# Patient Record
Sex: Male | Born: 1960
Health system: Southern US, Community
[De-identification: ages and names within clinical notes are randomized; demographics above are authoritative.]

## PROBLEM LIST (undated history)

## (undated) DIAGNOSIS — K219 Gastro-esophageal reflux disease without esophagitis: Secondary | ICD-10-CM

## (undated) DIAGNOSIS — I10 Essential (primary) hypertension: Secondary | ICD-10-CM

## (undated) DIAGNOSIS — T1490XA Injury, unspecified, initial encounter: Secondary | ICD-10-CM

## (undated) DIAGNOSIS — F329 Major depressive disorder, single episode, unspecified: Secondary | ICD-10-CM

## (undated) DIAGNOSIS — F32A Depression, unspecified: Secondary | ICD-10-CM

## (undated) DIAGNOSIS — E785 Hyperlipidemia, unspecified: Secondary | ICD-10-CM

## (undated) DIAGNOSIS — T50995A Adverse effect of other drugs, medicaments and biological substances, initial encounter: Secondary | ICD-10-CM

## (undated) DIAGNOSIS — K115 Sialolithiasis: Secondary | ICD-10-CM

## (undated) HISTORY — PX: OTHER SURGICAL HISTORY: SHX169

## (undated) HISTORY — PX: CHOLECYSTECTOMY: SHX55

## (undated) HISTORY — PX: CATARACT EXTRACTION: SUR2

## (undated) HISTORY — DX: Gastro-esophageal reflux disease without esophagitis: K21.9

## (undated) HISTORY — DX: Injury, unspecified, initial encounter: T14.90XA

## (undated) HISTORY — DX: Hyperlipidemia, unspecified: E78.5

## (undated) HISTORY — DX: Essential (primary) hypertension: I10

## (undated) HISTORY — DX: Major depressive disorder, single episode, unspecified: F32.9

## (undated) HISTORY — PX: TONSILLECTOMY: SUR1361

## (undated) HISTORY — DX: Depression, unspecified: F32.A

## (undated) HISTORY — DX: Sialolithiasis: K11.5

## (undated) HISTORY — PX: HERNIA REPAIR: SHX51

## (undated) HISTORY — DX: Adverse effect of other drugs, medicaments and biological substances, initial encounter: T50.995A

## (undated) HISTORY — PX: APPENDECTOMY: SHX54

---

## 1997-07-18 ENCOUNTER — Ambulatory Visit (HOSPITAL_COMMUNITY): Admission: RE | Admit: 1997-07-18 | Discharge: 1997-07-18 | Payer: Self-pay | Admitting: Gastroenterology

## 1998-02-27 ENCOUNTER — Ambulatory Visit (HOSPITAL_COMMUNITY): Admission: RE | Admit: 1998-02-27 | Discharge: 1998-02-27 | Payer: Self-pay | Admitting: Neurosurgery

## 1998-04-05 ENCOUNTER — Inpatient Hospital Stay (HOSPITAL_COMMUNITY): Admission: RE | Admit: 1998-04-05 | Discharge: 1998-04-09 | Payer: Self-pay | Admitting: Neurosurgery

## 1998-12-10 ENCOUNTER — Encounter: Payer: Self-pay | Admitting: *Deleted

## 1998-12-10 ENCOUNTER — Encounter: Admission: RE | Admit: 1998-12-10 | Discharge: 1998-12-10 | Payer: Self-pay | Admitting: *Deleted

## 1998-12-26 ENCOUNTER — Encounter: Payer: Self-pay | Admitting: Internal Medicine

## 1998-12-26 ENCOUNTER — Ambulatory Visit (HOSPITAL_COMMUNITY): Admission: RE | Admit: 1998-12-26 | Discharge: 1998-12-26 | Payer: Self-pay | Admitting: *Deleted

## 1999-01-02 ENCOUNTER — Encounter: Admission: RE | Admit: 1999-01-02 | Discharge: 1999-01-02 | Payer: Self-pay | Admitting: Neurosurgery

## 2001-01-13 ENCOUNTER — Emergency Department (HOSPITAL_COMMUNITY): Admission: EM | Admit: 2001-01-13 | Discharge: 2001-01-13 | Payer: Self-pay | Admitting: Emergency Medicine

## 2001-01-13 ENCOUNTER — Encounter: Payer: Self-pay | Admitting: Neurosurgery

## 2001-08-20 ENCOUNTER — Emergency Department (HOSPITAL_COMMUNITY): Admission: EM | Admit: 2001-08-20 | Discharge: 2001-08-20 | Payer: Self-pay | Admitting: *Deleted

## 2003-12-28 ENCOUNTER — Ambulatory Visit: Payer: Self-pay | Admitting: Internal Medicine

## 2004-03-22 ENCOUNTER — Ambulatory Visit: Payer: Self-pay | Admitting: Internal Medicine

## 2004-04-23 ENCOUNTER — Encounter: Admission: RE | Admit: 2004-04-23 | Discharge: 2004-05-14 | Payer: Self-pay | Admitting: Neurosurgery

## 2004-04-25 ENCOUNTER — Ambulatory Visit: Payer: Self-pay | Admitting: Internal Medicine

## 2004-05-06 ENCOUNTER — Ambulatory Visit: Payer: Self-pay | Admitting: Physical Medicine & Rehabilitation

## 2004-05-06 ENCOUNTER — Inpatient Hospital Stay (HOSPITAL_COMMUNITY): Admission: EM | Admit: 2004-05-06 | Discharge: 2004-05-14 | Payer: Self-pay | Admitting: Emergency Medicine

## 2004-05-17 ENCOUNTER — Ambulatory Visit: Payer: Self-pay | Admitting: Internal Medicine

## 2004-05-23 ENCOUNTER — Encounter: Admission: RE | Admit: 2004-05-23 | Discharge: 2004-06-03 | Payer: Self-pay | Admitting: Neurosurgery

## 2004-05-28 ENCOUNTER — Encounter: Admission: RE | Admit: 2004-05-28 | Discharge: 2004-05-28 | Payer: Self-pay | Admitting: Internal Medicine

## 2004-06-07 ENCOUNTER — Encounter: Payer: Self-pay | Admitting: Internal Medicine

## 2004-06-18 ENCOUNTER — Encounter: Payer: Self-pay | Admitting: Internal Medicine

## 2004-06-20 ENCOUNTER — Ambulatory Visit: Payer: Self-pay | Admitting: Internal Medicine

## 2004-06-25 ENCOUNTER — Encounter: Admission: RE | Admit: 2004-06-25 | Discharge: 2004-09-23 | Payer: Self-pay | Admitting: Internal Medicine

## 2004-06-25 ENCOUNTER — Ambulatory Visit: Payer: Self-pay | Admitting: Internal Medicine

## 2004-06-25 ENCOUNTER — Inpatient Hospital Stay (HOSPITAL_COMMUNITY): Admission: EM | Admit: 2004-06-25 | Discharge: 2004-06-29 | Payer: Self-pay | Admitting: Emergency Medicine

## 2004-07-03 ENCOUNTER — Ambulatory Visit: Payer: Self-pay | Admitting: Internal Medicine

## 2004-07-16 ENCOUNTER — Ambulatory Visit: Payer: Self-pay

## 2004-07-24 ENCOUNTER — Ambulatory Visit: Payer: Self-pay | Admitting: Internal Medicine

## 2004-08-05 ENCOUNTER — Ambulatory Visit: Payer: Self-pay

## 2005-03-24 ENCOUNTER — Ambulatory Visit: Payer: Self-pay | Admitting: Physical Medicine & Rehabilitation

## 2005-03-24 ENCOUNTER — Inpatient Hospital Stay (HOSPITAL_COMMUNITY): Admission: EM | Admit: 2005-03-24 | Discharge: 2005-03-28 | Payer: Self-pay | Admitting: Emergency Medicine

## 2005-03-28 ENCOUNTER — Inpatient Hospital Stay (HOSPITAL_COMMUNITY)
Admission: RE | Admit: 2005-03-28 | Discharge: 2005-03-31 | Payer: Self-pay | Admitting: Physical Medicine & Rehabilitation

## 2005-04-07 ENCOUNTER — Encounter
Admission: RE | Admit: 2005-04-07 | Discharge: 2005-04-30 | Payer: Self-pay | Admitting: Physical Medicine & Rehabilitation

## 2005-04-14 ENCOUNTER — Ambulatory Visit: Payer: Self-pay | Admitting: Internal Medicine

## 2005-04-22 ENCOUNTER — Ambulatory Visit: Payer: Self-pay | Admitting: Internal Medicine

## 2005-04-22 ENCOUNTER — Inpatient Hospital Stay (HOSPITAL_COMMUNITY): Admission: EM | Admit: 2005-04-22 | Discharge: 2005-04-29 | Payer: Self-pay | Admitting: Emergency Medicine

## 2005-05-07 ENCOUNTER — Encounter: Admission: RE | Admit: 2005-05-07 | Discharge: 2005-05-29 | Payer: Self-pay | Admitting: Internal Medicine

## 2005-05-08 ENCOUNTER — Ambulatory Visit: Payer: Self-pay | Admitting: Internal Medicine

## 2006-07-20 ENCOUNTER — Ambulatory Visit: Payer: Self-pay | Admitting: Internal Medicine

## 2006-07-20 LAB — CONVERTED CEMR LAB
ALT: 38 units/L (ref 0–40)
BUN: 8 mg/dL (ref 6–23)
Basophils Absolute: 0 10*3/uL (ref 0.0–0.1)
Basophils Relative: 0.7 % (ref 0.0–1.0)
Chloride: 102 meq/L (ref 96–112)
Creatinine, Ser: 1 mg/dL (ref 0.4–1.5)
Creatinine,U: 131.5 mg/dL
Direct LDL: 133.7 mg/dL
Eosinophils Absolute: 0.1 10*3/uL (ref 0.0–0.6)
HCT: 46.8 % (ref 39.0–52.0)
HDL: 24.8 mg/dL — ABNORMAL LOW (ref >39.0)
Hemoglobin: 16.3 g/dL (ref 13.0–17.0)
Lymphocytes Relative: 41 % (ref 12.0–46.0)
MCV: 89.7 fL (ref 78.0–100.0)
Microalb Creat Ratio: 2.3 mg/g (ref 0.0–30.0)
Microalb, Ur: 0.3 mg/dL (ref 0.0–1.9)
Neutro Abs: 2.8 10*3/uL (ref 1.4–7.7)
Neutrophils Relative %: 48.2 % (ref 43.0–77.0)
Total Bilirubin: 1.2 mg/dL (ref 0.3–1.2)
Total Protein: 7.4 g/dL (ref 6.0–8.3)
VLDL: 81 mg/dL — ABNORMAL HIGH (ref 0–40)
WBC: 5.6 10*3/uL (ref 4.5–10.5)

## 2006-08-12 ENCOUNTER — Encounter: Payer: Self-pay | Admitting: Internal Medicine

## 2006-08-12 DIAGNOSIS — E1169 Type 2 diabetes mellitus with other specified complication: Secondary | ICD-10-CM | POA: Insufficient documentation

## 2006-08-12 DIAGNOSIS — J45909 Unspecified asthma, uncomplicated: Secondary | ICD-10-CM | POA: Insufficient documentation

## 2006-08-12 DIAGNOSIS — E1159 Type 2 diabetes mellitus with other circulatory complications: Secondary | ICD-10-CM | POA: Insufficient documentation

## 2006-08-12 DIAGNOSIS — D869 Sarcoidosis, unspecified: Secondary | ICD-10-CM

## 2006-08-12 DIAGNOSIS — K219 Gastro-esophageal reflux disease without esophagitis: Secondary | ICD-10-CM

## 2006-08-12 DIAGNOSIS — F32A Depression, unspecified: Secondary | ICD-10-CM | POA: Insufficient documentation

## 2006-08-12 DIAGNOSIS — I10 Essential (primary) hypertension: Secondary | ICD-10-CM

## 2006-08-12 DIAGNOSIS — M109 Gout, unspecified: Secondary | ICD-10-CM

## 2006-08-12 DIAGNOSIS — E785 Hyperlipidemia, unspecified: Secondary | ICD-10-CM | POA: Insufficient documentation

## 2006-08-12 DIAGNOSIS — J309 Allergic rhinitis, unspecified: Secondary | ICD-10-CM | POA: Insufficient documentation

## 2006-08-12 DIAGNOSIS — F329 Major depressive disorder, single episode, unspecified: Secondary | ICD-10-CM

## 2006-09-21 ENCOUNTER — Telehealth: Payer: Self-pay | Admitting: Internal Medicine

## 2006-10-20 ENCOUNTER — Telehealth: Payer: Self-pay | Admitting: Internal Medicine

## 2006-11-02 ENCOUNTER — Ambulatory Visit: Payer: Self-pay | Admitting: Internal Medicine

## 2006-11-06 LAB — CONVERTED CEMR LAB
Cholesterol: 215 mg/dL (ref 0–200)
Direct LDL: 150.4 mg/dL
HDL: 21.2 mg/dL — ABNORMAL LOW (ref >39.0)
Total CHOL/HDL Ratio: 10.1
Triglycerides: 261 mg/dL (ref 0–149)

## 2006-12-29 ENCOUNTER — Ambulatory Visit: Payer: Self-pay | Admitting: Internal Medicine

## 2006-12-29 LAB — CONVERTED CEMR LAB: Blood Glucose, Fingerstick: 158

## 2007-01-19 ENCOUNTER — Telehealth: Payer: Self-pay | Admitting: Internal Medicine

## 2007-02-22 ENCOUNTER — Encounter: Payer: Self-pay | Admitting: Internal Medicine

## 2007-03-12 ENCOUNTER — Telehealth: Payer: Self-pay | Admitting: Internal Medicine

## 2007-04-03 IMAGING — CR DG LUMBAR SPINE COMPLETE 4+V
5 series · 5 of 5 positions shown · non-contrast
Comparison: none

HISTORY: Pain, fall

THORACIC SPINE 2 VIEWS:
Vertebral body heights maintained.
No fracture or subluxation.
No bone destruction.

[t l-spine oblique exposure (1 of 2)]
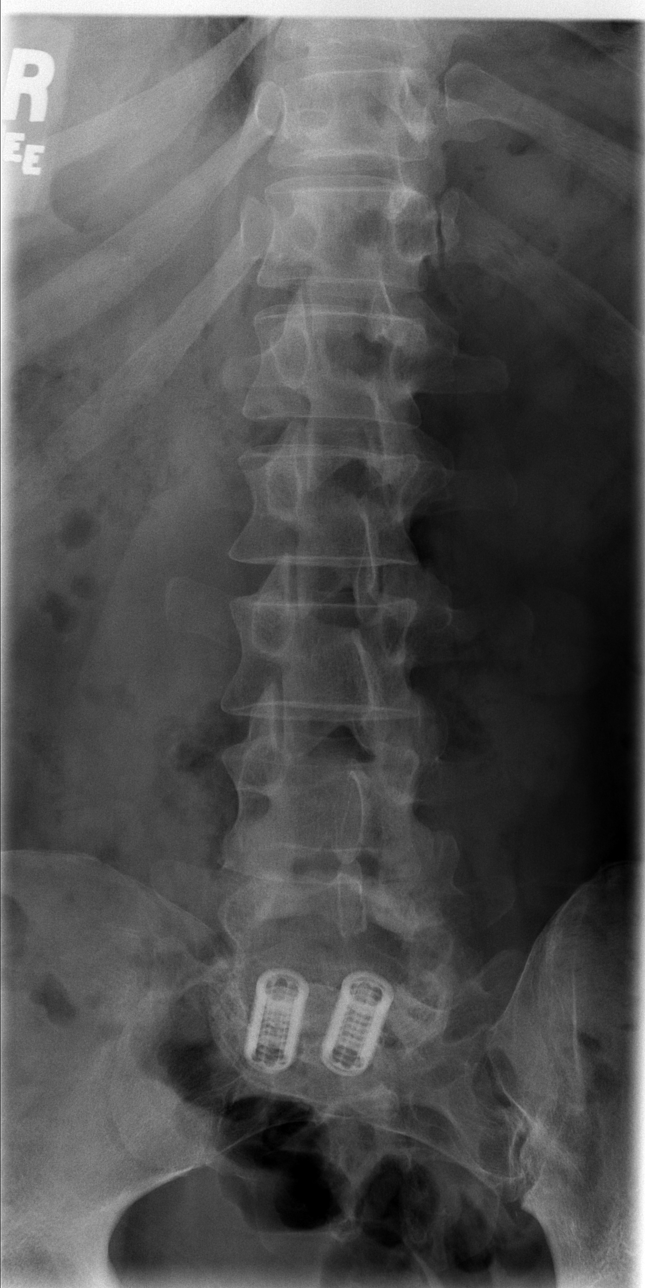

[t l-spine a.p.]
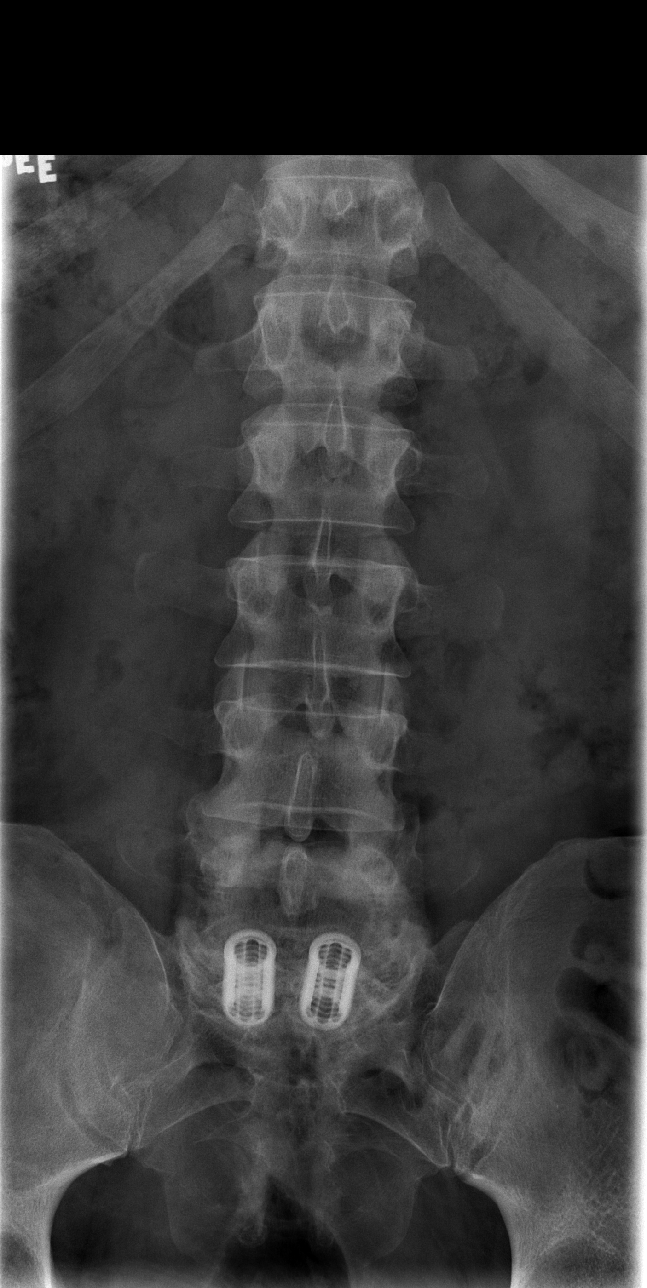

[t l-spine oblique exposure (2 of 2)]
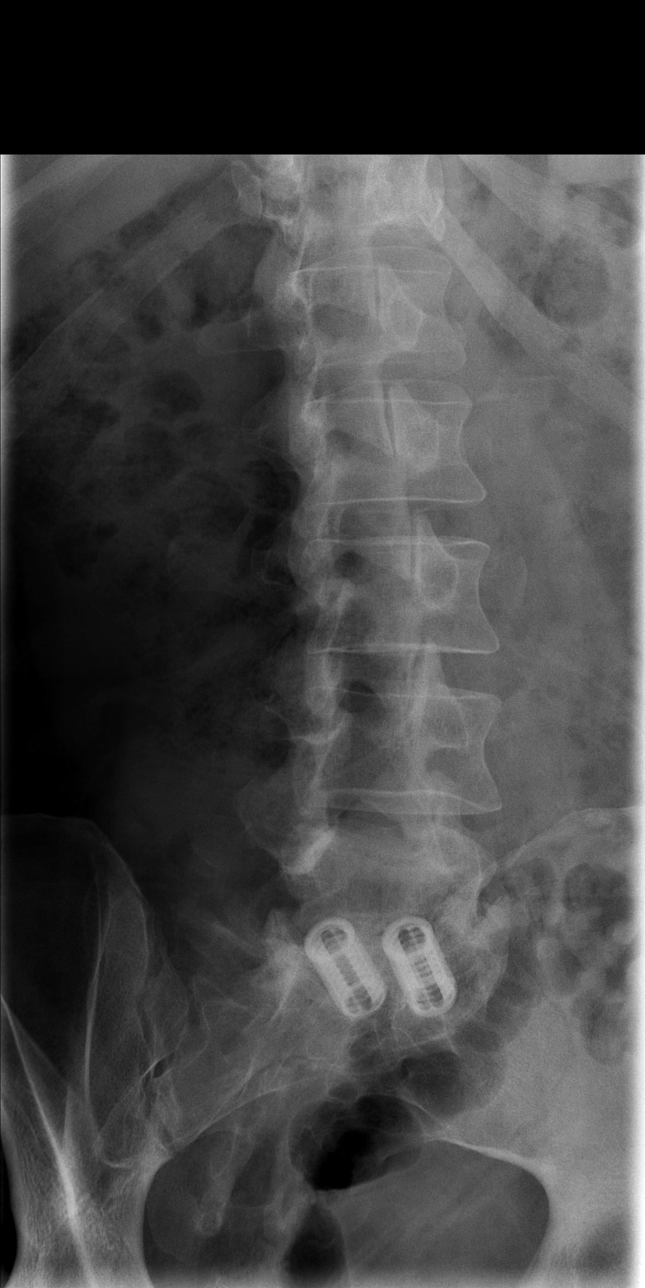

[t l-spine lat]
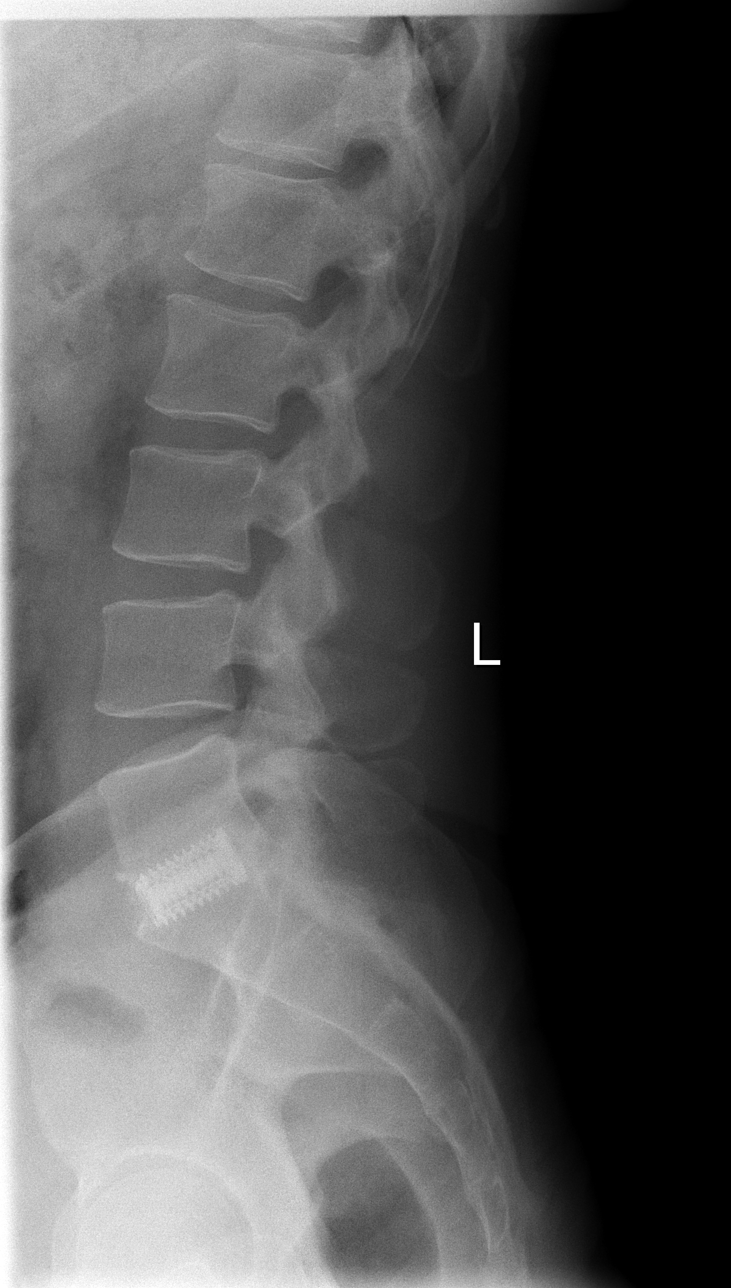

[t l-spine l5-s1 spot]
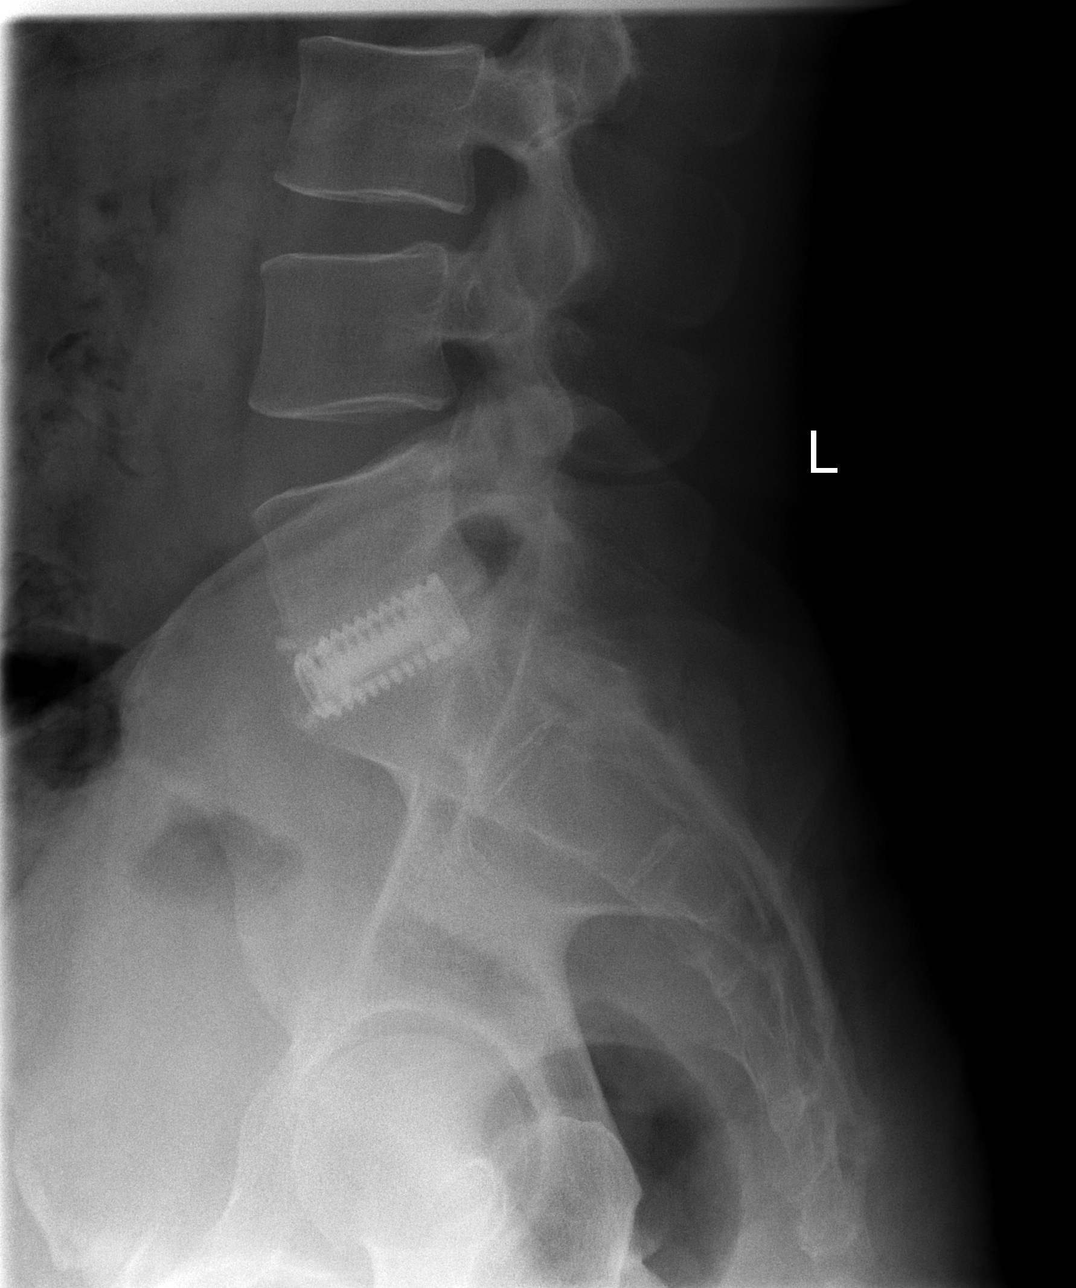

[5 of 5 positions shown; findings below may reference images not displayed]

IMPRESSION: No acute bony abnormalities.

LUMBAR SPINE 4 VIEWS:

Bilateral ray cages at lumbosacral junction.
No fracture or subluxation.
Vertebral body and disc space heights otherwise maintained.
No spondylolysis or bone destruction.
IMPRESSION: Fusion at L5-S1.
No acute bony abnormalities.

LEFT SHOULDER 3 VIEWS:

AC joint alignment normal.
No fracture, dislocation, or bone destruction.
IMPRESSION: No acute bony abnormalities.

LEFT HUMERUS 2 VIEWS:

No fracture, dislocation, or bone destruction.
Mineralization normal.
IMPRESSION: No acute abnormalities.

## 2007-04-03 IMAGING — CR DG SHOULDER 2+V*L*
3 series · 3 of 3 positions shown · non-contrast
Comparison: none

HISTORY: Pain, fall

THORACIC SPINE 2 VIEWS:
Vertebral body heights maintained.
No fracture or subluxation.
No bone destruction.

[t shoulder ap internal left]
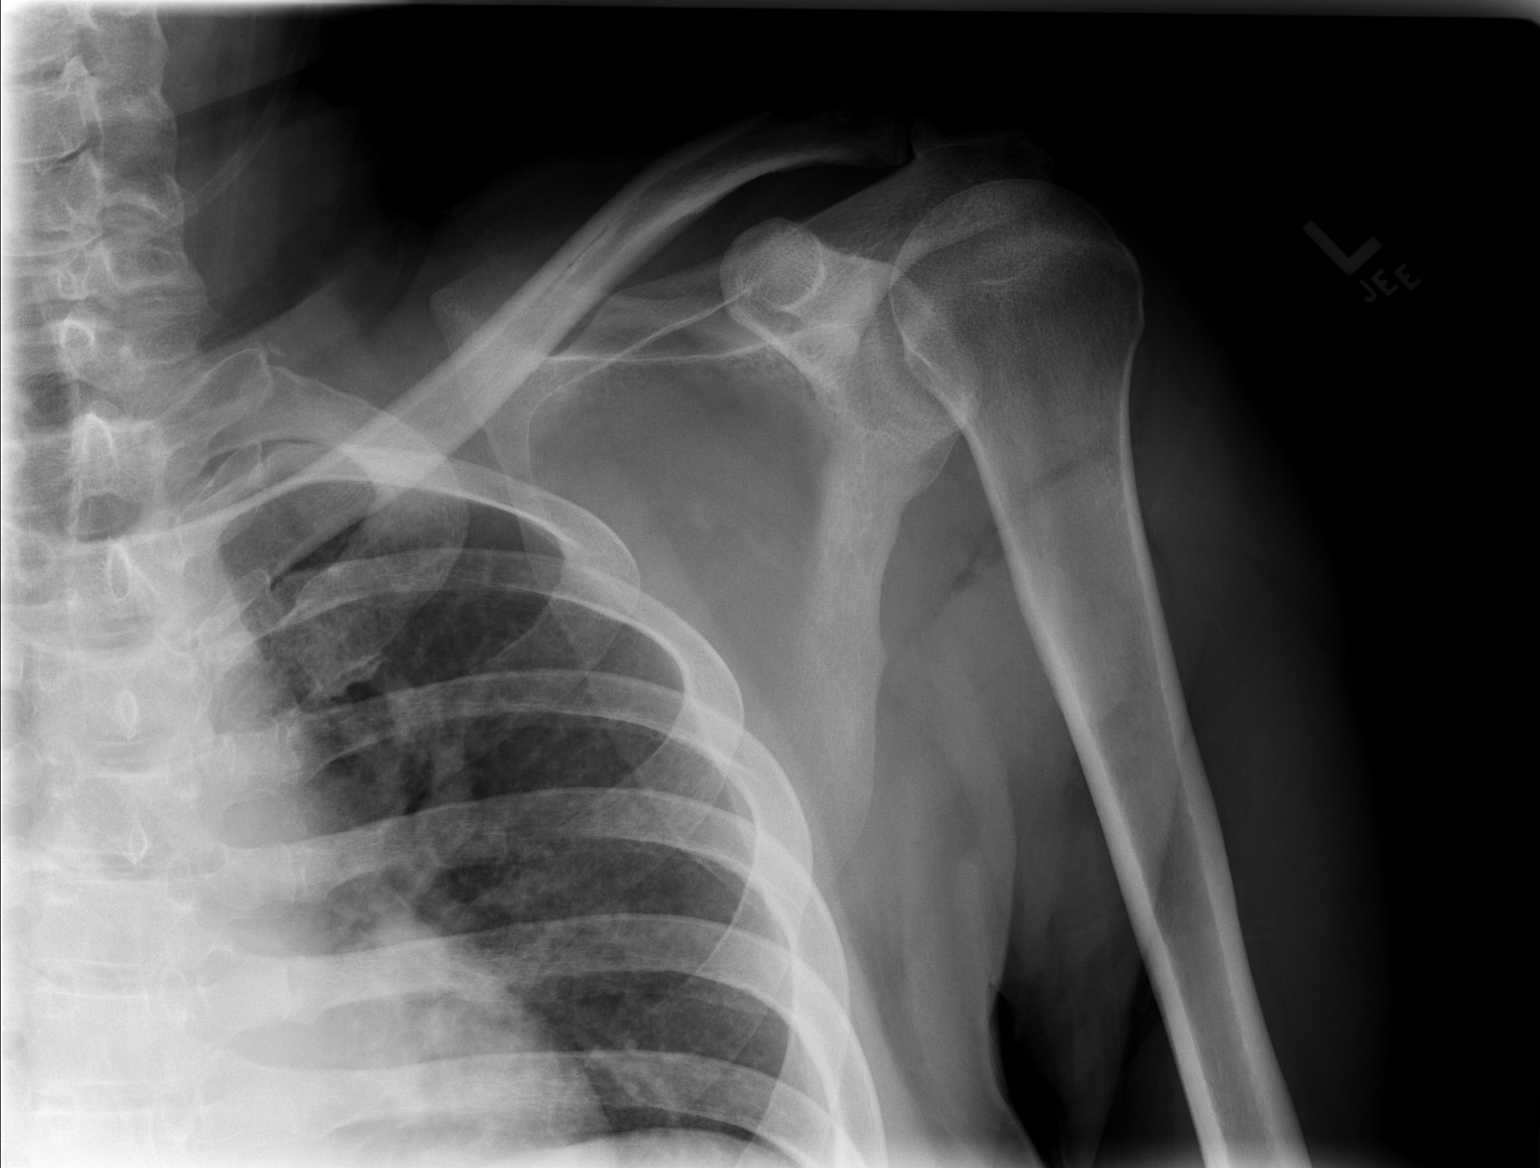

[t shoulder ap external left]
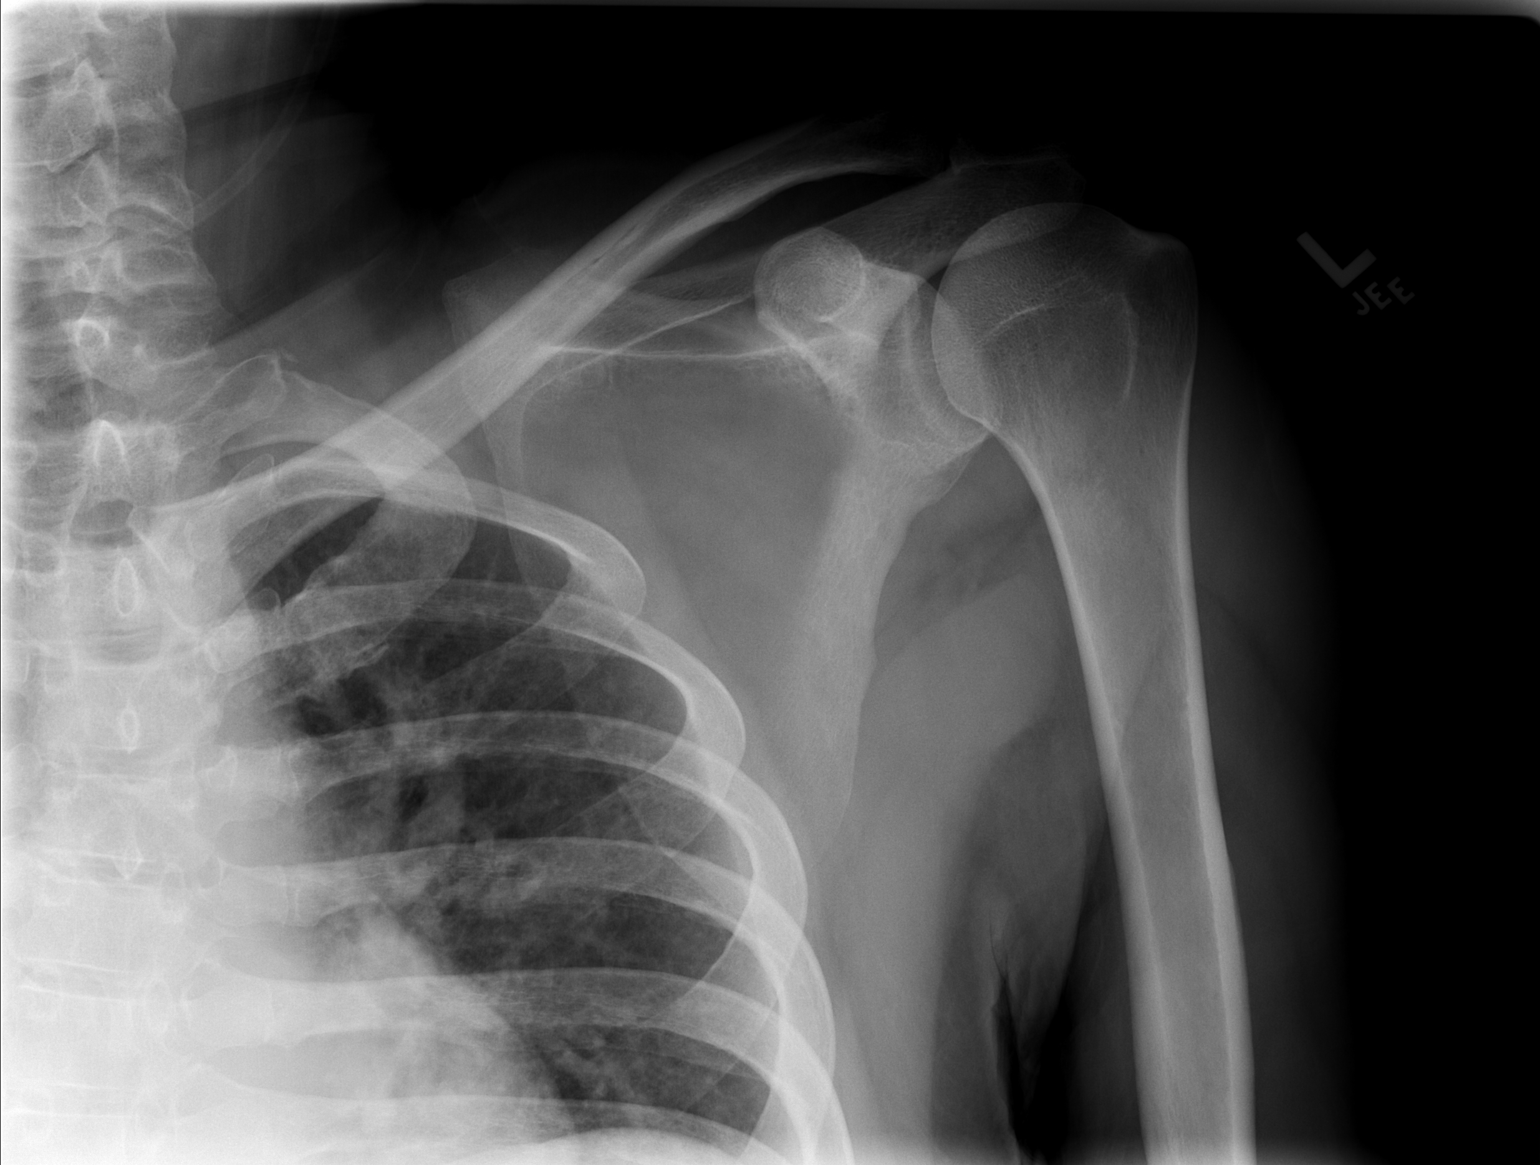

[t shoulder y view left]
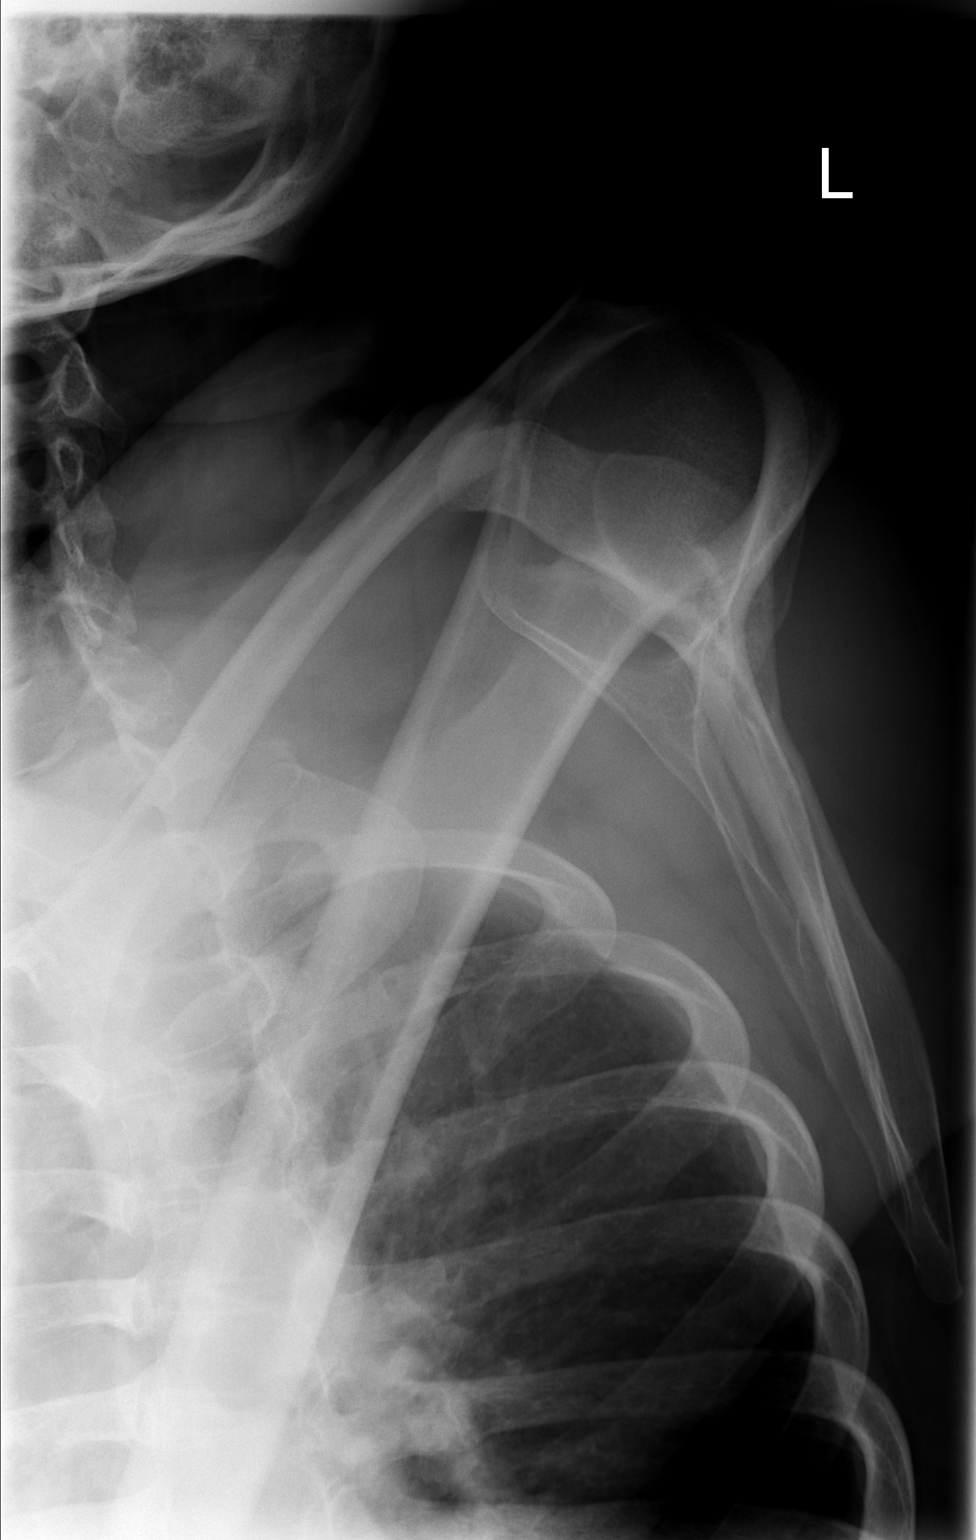

[3 of 3 positions shown; findings below may reference images not displayed]

IMPRESSION: No acute bony abnormalities.

LUMBAR SPINE 4 VIEWS:

Bilateral ray cages at lumbosacral junction.
No fracture or subluxation.
Vertebral body and disc space heights otherwise maintained.
No spondylolysis or bone destruction.
IMPRESSION: Fusion at L5-S1.
No acute bony abnormalities.

LEFT SHOULDER 3 VIEWS:

AC joint alignment normal.
No fracture, dislocation, or bone destruction.
IMPRESSION: No acute bony abnormalities.

LEFT HUMERUS 2 VIEWS:

No fracture, dislocation, or bone destruction.
Mineralization normal.
IMPRESSION: No acute abnormalities.

## 2007-04-22 ENCOUNTER — Telehealth: Payer: Self-pay | Admitting: Internal Medicine

## 2007-05-03 ENCOUNTER — Ambulatory Visit: Payer: Self-pay | Admitting: Internal Medicine

## 2007-06-02 LAB — CONVERTED CEMR LAB
BUN: 13 mg/dL (ref 6–23)
CO2: 31 meq/L (ref 19–32)
Chloride: 103 meq/L (ref 96–112)
Cholesterol: 243 mg/dL (ref 0–200)
GFR calc non Af Amer: 110 mL/min
Hgb A1c MFr Bld: 6.4 % — ABNORMAL HIGH (ref 4.6–6.0)
Sodium: 139 meq/L (ref 135–145)
VLDL: 119 mg/dL — ABNORMAL HIGH (ref 0–40)

## 2007-07-09 ENCOUNTER — Telehealth: Payer: Self-pay | Admitting: *Deleted

## 2007-07-20 ENCOUNTER — Telehealth: Payer: Self-pay | Admitting: Internal Medicine

## 2007-11-02 ENCOUNTER — Telehealth: Payer: Self-pay | Admitting: *Deleted

## 2008-02-18 LAB — HM DIABETES EYE EXAM: HM Diabetic Eye Exam: NORMAL

## 2008-05-26 ENCOUNTER — Telehealth: Payer: Self-pay | Admitting: *Deleted

## 2008-08-07 ENCOUNTER — Telehealth: Payer: Self-pay | Admitting: Internal Medicine

## 2008-08-14 ENCOUNTER — Ambulatory Visit: Payer: Self-pay | Admitting: Internal Medicine

## 2008-08-14 LAB — CONVERTED CEMR LAB
Creatinine,U: 98 mg/dL
Ketones, urine, test strip: NEGATIVE
Microalb, Ur: 0.3 mg/dL (ref 0.0–1.9)
Nitrite: NEGATIVE

## 2008-08-18 ENCOUNTER — Encounter: Payer: Self-pay | Admitting: *Deleted

## 2008-08-24 LAB — CONVERTED CEMR LAB
Albumin: 4.1 g/dL (ref 3.5–5.2)
Alkaline Phosphatase: 61 units/L (ref 39–117)
Basophils Absolute: 0 10*3/uL (ref 0.0–0.1)
Basophils Relative: 0.6 % (ref 0.0–3.0)
Cholesterol: 195 mg/dL (ref 0–200)
Creatinine, Ser: 0.9 mg/dL (ref 0.4–1.5)
Eosinophils Absolute: 0.1 10*3/uL (ref 0.0–0.7)
HCT: 43 % (ref 39.0–52.0)
HDL: 25.4 mg/dL — ABNORMAL LOW (ref >39.00)
Hgb A1c MFr Bld: 7.6 % — ABNORMAL HIGH (ref 4.6–6.5)
Lymphocytes Relative: 36.4 % (ref 12.0–46.0)
MCHC: 35.4 g/dL (ref 30.0–36.0)
MCV: 90.4 fL (ref 78.0–100.0)
Monocytes Relative: 6.2 % (ref 3.0–12.0)
Neutro Abs: 3.9 10*3/uL (ref 1.4–7.7)
Neutrophils Relative %: 54.9 % (ref 43.0–77.0)
Platelets: 171 10*3/uL (ref 150.0–400.0)
Potassium: 4 meq/L (ref 3.5–5.1)
RBC: 4.75 M/uL (ref 4.22–5.81)
Sodium: 140 meq/L (ref 135–145)
TSH: 1.49 microintl units/mL (ref 0.35–5.50)
Total Protein: 7 g/dL (ref 6.0–8.3)
Triglycerides: 516 mg/dL — ABNORMAL HIGH (ref 0.0–149.0)
VLDL: 103.2 mg/dL — ABNORMAL HIGH (ref 0.0–40.0)

## 2008-08-25 ENCOUNTER — Telehealth: Payer: Self-pay | Admitting: *Deleted

## 2008-09-06 ENCOUNTER — Telehealth: Payer: Self-pay | Admitting: *Deleted

## 2008-09-20 ENCOUNTER — Telehealth: Payer: Self-pay | Admitting: *Deleted

## 2008-10-02 ENCOUNTER — Telehealth: Payer: Self-pay | Admitting: *Deleted

## 2008-10-09 ENCOUNTER — Ambulatory Visit: Payer: Self-pay | Admitting: Internal Medicine

## 2008-10-09 DIAGNOSIS — T50995A Adverse effect of other drugs, medicaments and biological substances, initial encounter: Secondary | ICD-10-CM

## 2008-10-09 DIAGNOSIS — E119 Type 2 diabetes mellitus without complications: Secondary | ICD-10-CM

## 2008-10-09 HISTORY — DX: Adverse effect of other drugs, medicaments and biological substances, initial encounter: T50.995A

## 2008-10-09 LAB — CONVERTED CEMR LAB: Blood Glucose, Fingerstick: 136

## 2008-10-11 ENCOUNTER — Telehealth: Payer: Self-pay | Admitting: *Deleted

## 2008-11-06 ENCOUNTER — Telehealth: Payer: Self-pay | Admitting: *Deleted

## 2008-11-30 ENCOUNTER — Telehealth: Payer: Self-pay | Admitting: *Deleted

## 2008-12-25 ENCOUNTER — Telehealth: Payer: Self-pay | Admitting: Internal Medicine

## 2009-01-03 ENCOUNTER — Telehealth: Payer: Self-pay | Admitting: *Deleted

## 2009-03-05 ENCOUNTER — Telehealth: Payer: Self-pay | Admitting: *Deleted

## 2009-03-07 ENCOUNTER — Telehealth: Payer: Self-pay | Admitting: Internal Medicine

## 2009-04-02 ENCOUNTER — Ambulatory Visit: Payer: Self-pay | Admitting: Internal Medicine

## 2009-04-02 DIAGNOSIS — M6740 Ganglion, unspecified site: Secondary | ICD-10-CM

## 2009-04-11 ENCOUNTER — Encounter: Payer: Self-pay | Admitting: *Deleted

## 2009-04-12 LAB — CONVERTED CEMR LAB
BUN: 14 mg/dL (ref 6–23)
Calcium: 8.9 mg/dL (ref 8.4–10.5)
Chloride: 106 meq/L (ref 96–112)
Creatinine, Ser: 0.9 mg/dL (ref 0.4–1.5)
Direct LDL: 107.7 mg/dL
GFR calc non Af Amer: 95.23 mL/min (ref >60)
Glucose, Bld: 129 mg/dL — ABNORMAL HIGH (ref 70–99)
Microalb Creat Ratio: 7.1 mg/g (ref 0.0–30.0)
Triglycerides: 363 mg/dL — ABNORMAL HIGH (ref 0.0–149.0)

## 2009-04-30 ENCOUNTER — Telehealth: Payer: Self-pay | Admitting: *Deleted

## 2009-08-27 ENCOUNTER — Telehealth: Payer: Self-pay | Admitting: *Deleted

## 2009-09-01 ENCOUNTER — Encounter: Payer: Self-pay | Admitting: *Deleted

## 2009-09-01 ENCOUNTER — Encounter: Payer: Self-pay | Admitting: Internal Medicine

## 2009-09-01 LAB — CONVERTED CEMR LAB
HDL: 27 mg/dL
Triglyceride fasting, serum: 249 mg/dL

## 2009-10-01 ENCOUNTER — Telehealth: Payer: Self-pay | Admitting: Internal Medicine

## 2009-12-17 ENCOUNTER — Encounter: Payer: Self-pay | Admitting: *Deleted

## 2010-01-21 ENCOUNTER — Ambulatory Visit: Payer: Self-pay | Admitting: Internal Medicine

## 2010-01-24 LAB — CONVERTED CEMR LAB
ALT: 45 units/L (ref 0–53)
Alkaline Phosphatase: 48 units/L (ref 39–117)
Basophils Absolute: 0 10*3/uL (ref 0.0–0.1)
Calcium: 9.4 mg/dL (ref 8.4–10.5)
Direct LDL: 154.1 mg/dL
Eosinophils Relative: 2.5 % (ref 0.0–5.0)
Glucose, Bld: 162 mg/dL — ABNORMAL HIGH (ref 70–99)
HCT: 43.6 % (ref 39.0–52.0)
HDL: 30.2 mg/dL — ABNORMAL LOW (ref >39.00)
MCHC: 34.7 g/dL (ref 30.0–36.0)
MCV: 90.9 fL (ref 78.0–100.0)
Microalb Creat Ratio: 0.3 mg/g (ref 0.0–30.0)
Monocytes Absolute: 0.4 10*3/uL (ref 0.1–1.0)
Neutrophils Relative %: 45.8 % (ref 43.0–77.0)
Potassium: 4.2 meq/L (ref 3.5–5.1)
RBC: 4.79 M/uL (ref 4.22–5.81)
RDW: 13.5 % (ref 11.5–14.6)
Sodium: 135 meq/L (ref 135–145)
Total Bilirubin: 0.7 mg/dL (ref 0.3–1.2)
Total Protein: 7 g/dL (ref 6.0–8.3)
VLDL: 71.2 mg/dL — ABNORMAL HIGH (ref 0.0–40.0)

## 2010-02-19 ENCOUNTER — Telehealth: Payer: Self-pay | Admitting: *Deleted

## 2010-03-19 NOTE — Letter (Signed)
Summary: Generic Letter  [facility] at [facility]  [address] [place], [place] [address]   Phone: [phone]  Fax: [phone]    04/11/2009  HARLO FABELA [address] [place], [place]  [address]  Dear Mr. PAIGE,  I have tried to call you about your test results but was unable to leave a message. Please call our office at [phone].         Sincerely,   Tor Netters, CMA (AAMA)

## 2010-03-19 NOTE — Progress Notes (Signed)
Summary: refill  Phone Note From Pharmacy   [facility]er: [facility] [address]* Reason for Call: Needs renewal Details for Reason: metformin Initial call taken by: Romualdo Bolk, CMA Duncan Dull),  April 30, 2009 5:00 PM  Follow-up for Phone Call        Rx sent to pharmacy. Follow-up by: Romualdo Bolk, CMA (AAMA),  April 30, 2009 5:00 PM    Prescriptions: GLUCOPHAGE XR 500 MG XR24H-TAB (METFORMIN HCL) 2  by mouth once daily  #180 x 1   Entered by:   Romualdo Bolk, CMA (AAMA)   Authorized by:   Madelin Headings MD   Signed by:   Romualdo Bolk, CMA (AAMA) on 04/30/2009   Method used:   Electronically to        [facility] (retail)       [address]       [place], [place]  [address]       Ph: [phone]       Fax: [phone]   RxID:   [id number]

## 2010-03-19 NOTE — Progress Notes (Signed)
Summary: refill on metformin  Phone Note From Pharmacy   Caller: [facility] Reason for Call: Needs renewal Details for Reason: Metformin  Initial call taken by: Romualdo Bolk, CMA Duncan Dull),  March 05, 2009 4:49 PM  Follow-up for Phone Call        Rx sent electronically but pt needs to schedule a follow up appt before next refill. Follow-up by: Romualdo Bolk, CMA (AAMA),  March 05, 2009 4:50 PM    Prescriptions: GLUCOPHAGE XR 500 MG XR24H-TAB (METFORMIN HCL) 2  by mouth once daily  #180 x 0   Entered by:   Romualdo Bolk, CMA (AAMA)   Authorized by:   Madelin Headings MD   Signed by:   Romualdo Bolk, CMA (AAMA) on 03/05/2009   Method used:   Electronically to        [facility] [place] (retail)       [address]       [place], [place]  [address]       Ph: [phone]       Fax: [phone]   RxID:   [id number]

## 2010-03-19 NOTE — Progress Notes (Signed)
Summary: refill on metformin   Phone Note From Pharmacy Call back at [phone] (wife)   [facility]er: [facility] [address] Reason for Call: Needs renewal Details for Reason: Metformin Er 500 Initial call taken by: Romualdo Bolk, CMA Duncan Dull),  October 01, 2009 1:46 PM  Follow-up for Phone Call        Rx sent to pharmacy x 1 but pt needs to have a1c, lipids lfts BMP  and then OV before next refill. Left message for wife to call back about this. Follow-up by: Romualdo Bolk, CMA Duncan Dull),  October 01, 2009 1:47 PM  Additional Follow-up for Phone Call Additional follow up Details #1::        LM for wife to call back to schedule labs and rov for husband. Additional Follow-up by: Romualdo Bolk, CMA Duncan Dull),  October 05, 2009 10:55 AM    Additional Follow-up for Phone Call Additional follow up Details #2::    Pt's wife aware of this and pt's job just ended and has no ins or money to come in. He did have some labs about 4 weeks ago before he lost his job. She is going to get these results to [place]. He is currently interviewing for a job.  Romualdo Bolk, CMA (AAMA)  October 11, 2009 11:10 AM   Prescriptions: GLUCOPHAGE XR 500 MG XR24H-TAB (METFORMIN HCL) 2  by mouth once daily  #60 x 0   Entered by:   Romualdo Bolk, CMA (AAMA)   Authorized by:   Madelin Headings MD   Signed by:   Romualdo Bolk, CMA (AAMA) on 10/01/2009   Method used:   Electronically to        [facility] [place] (retail)       [address]       [place], [place]  [address]       Ph: [phone]       Fax: [phone]   RxID:   [id number]

## 2010-03-19 NOTE — Assessment & Plan Note (Signed)
Summary: follow appt/pt coming in fasting/cjr   Vital Signs:  Patient profile:   50 year old male Weight:      228.5 pounds Pulse rate:   66 / minute BP sitting:   112 / 79  (right arm) Cuff size:   large  Vitals Entered By: Romualdo Bolk, CMA (AAMA) (April 02, 2009 8:34 AM) CC: Follow-up visit on DM and BP. Pt is fasting for labs., Hypertension Management CBG Result 139   History of Present Illness: Scott Pineda  comesin with wife for follow up of multiple medical problems specifically DM. Still driving and has schedule where  limited times to come to the office.  Since last visit  here  there have been no major changes in health status  .    Dm  120 110. up this am.  No low No new meds. Since last  visit  had  left knee injury.   ligament. on the job getting better  Has mildly tender lump on right wrist without numbness or weakness   CV pulm. no symptom . no cough or change in Gi. now able to tolerate the metformin.    Hypertension History:      He denies headache, chest pain, palpitations, dyspnea with exertion, orthopnea, PND, peripheral edema, visual symptoms, neurologic problems, syncope, and side effects from treatment.  He notes no problems with any antihypertensive medication side effects.        Positive major cardiovascular risk factors include male age 106 years old or older, diabetes, hyperlipidemia, and hypertension.  Negative major cardiovascular risk factors include non-tobacco-user status.      Preventive Screening-Counseling & Management  Alcohol-Tobacco     Alcohol drinks/day: 0     Smoking Status: never     Passive Smoke Exposure: no  Caffeine-Diet-Exercise     Caffeine use/day: 1-2 cup      Does Patient Exercise: yes  Current Medications (verified): 1)  Onetouch Ultra Test  Strp (Glucose Blood) .... One Touch Ultra 2 Test Strips. Pt Uses Two Times A Day or As Directed 2)  Prinivil 5 Mg Tabs (Lisinopril) .... Take 2  Tablet By Mouth Once A  Day. 3)  Zoloft 100 Mg Tabs (Sertraline Hcl) .... Take 1 By Mouth Once Daily 4)  Glucophage Xr 500 Mg Xr24h-Tab (Metformin Hcl) .... 2  By Mouth Once Daily  Allergies (verified): 1)  Motrin (Ibuprofen)  Past History:  Past medical, surgical, family and social histories (including risk factors) reviewed, and no changes noted (except as noted below).  Past Medical History: Reviewed history from 08/14/2008 and no changes required. Allergic rhinitis Asthma Depression GERD Gout Hyperlipidemia Hypertension see pst hx of falling trauma and back pain with side effects of medication  Parkinsonian now resolved  salivary stone   extraction    Past Surgical History: Reviewed history from 08/14/2008 and no changes required. Appendectomy Cholecystectomy Tonsillectomy Hernia Repair cataract surgery left 2009    right  2006  Past History:  Care Management: Psychiatry: Christen Bame eppes   ENT:  Annitta Jersey ? gramig  for shoulder   Family History: Reviewed history from 05/03/2007 and no changes required. adopted  Social History: Reviewed history from 08/14/2008 and no changes required.  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 hhof 4  9 dogs dod had puppies   Review of  Systems  The patient denies anorexia, fever, weight loss, weight gain, vision loss, decreased hearing, hoarseness, chest pain, syncope, dyspnea on exertion, peripheral edema, prolonged cough, headaches, hemoptysis, abdominal pain, melena, hematochezia, severe indigestion/heartburn, hematuria, muscle weakness, unusual weight change, abnormal bleeding, enlarged lymph nodes, and angioedema.         got though the  holidays   ok.    lump on right wrist   Physical Exam  General:  Well-developed,well-nourished,in no acute distress; alert,appropriate and cooperative throughout examination Head:   normocephalic and atraumatic.   Eyes:  vision grossly intact, pupils equal, and pupils round.  glasses  Ears:  R ear normal, L ear normal, and no external deformities.   Mouth:  pharynx pink and moist.   Neck:  No deformities, masses, or tenderness noted. Lungs:  Normal respiratory effort, chest expands symmetrically. Lungs are clear to auscultation, no crackles or wheezes.no dullness.   Heart:  Normal rate and regular rhythm. S1 and S2 normal without gallop, murmur, click, rub or other extra sounds.no lifts.   Abdomen:  Bowel sounds positive,abdomen soft and non-tender without masses, organomegaly or hernias noted. well healed scar midline Msk:  no joint swelling and no joint warmth.    ganglion cyst  dorsal aspect radial side of right wrist.   nl rom no redness or warmth.  Pulses:  pulses intact without delay   Extremities:  no clubbing cyanosis or edema  Neurologic:  alert & oriented X3, sensation intact to light touch, and gait normal.   Skin:  turgor normal and color normal.   le top of feet and to mid tibia discrete follicular red   rash  non tender and no vesicle or  ulcers .    Cervical Nodes:  No lymphadenopathy noted Psych:  Oriented X3, normally interactive, good eye contact, not anxious appearing, and not depressed appearing.    Diabetes Management Exam:    Foot Exam (with socks and/or shoes not present):       Sensory-Monofilament:          Left foot: normal          Right foot: normal       Inspection:          Left foot: normal          Right foot: normal       Nails:          Left foot: normal          Right foot: normal    Eye Exam:       Eye Exam done elsewhere          Date: 02/18/2008          Results: normal          Done by: Epps   Impression & Recommendations:  Problem # 1:  DIABETES-TYPE 2 (ICD-250.00) will get eye exam soon.  His updated medication list for this problem includes:    Prinivil 5 Mg Tabs (Lisinopril) .Marland Kitchen... Take 2  tablet by mouth once a  day.    Glucophage Xr 500 Mg Xr24h-tab (Metformin hcl) .Marland Kitchen... 2  by mouth once daily  Orders: TLB-A1C / Hgb A1C (Glycohemoglobin) (83036-A1C) TLB-Lipid Panel (80061-LIPID) TLB-Microalbumin/Creat Ratio, Urine (82043-MALB) TLB-BMP (Basic Metabolic Panel-BMET) (80048-METABOL)  Problem # 2:  HYPERTENSION (ICD-401.9) controlled  His updated medication list for this problem includes:    Prinivil 5 Mg Tabs (Lisinopril) .Marland Kitchen... Take 2  tablet by mouth once a day.  Orders: TLB-A1C /  Hgb A1C (Glycohemoglobin) (83036-A1C) TLB-Lipid Panel (80061-LIPID) TLB-Microalbumin/Creat Ratio, Urine (82043-MALB)  Problem # 3:  HYPERLIPIDEMIA (ICD-272.4)  Orders: TLB-A1C / Hgb A1C (Glycohemoglobin) (83036-A1C) TLB-Lipid Panel (80061-LIPID) TLB-Microalbumin/Creat Ratio, Urine (82043-MALB)  Labs Reviewed: SGOT: 25 (08/14/2008)   SGPT: 32 (08/14/2008)  Prior 10 Yr Risk Heart Disease: Not enough information (08/14/2008)   HDL:25.40 (08/14/2008), 22.4 (05/03/2007)  LDL:DEL (05/03/2007), DEL (11/02/2006)  Chol:195 (08/14/2008), 243 (05/03/2007)  Trig:516.0 (08/14/2008), 596 (05/03/2007)  Problem # 4:  GANGLION CYST, WRIST, RIGHT (ICD-727.41) Assessment: New no alarm features  disc options  conservative rx and observation  refer if needed.    Problem # 5:  RASH AND OTHER NONSPECIFIC SKIN ERUPTION (ICD-782.1) unsure cause  on distal extremities  looks like follicular based   but localized  .  will follow.  Complete Medication List: 1)  Onetouch Ultra Test Strp (Glucose blood) .... One touch ultra 2 test strips. pt uses two times a day or as directed 2)  Prinivil 5 Mg Tabs (Lisinopril) .... Take 2  tablet by mouth once a day. 3)  Zoloft 100 Mg Tabs (Sertraline hcl) .... Take 1 by mouth once daily 4)  Glucophage Xr 500 Mg Xr24h-tab (Metformin hcl) .... 2  by mouth once daily  Other Orders: Capillary Blood Glucose/CBG ([address])  Hypertension Assessment/Plan:      The patient's hypertensive risk group is  category C: Target organ damage and/or diabetes.  Today's blood pressure is 112/79.  His blood pressure goal is < 140/90.  Patient Instructions: 1)  continue medications 2)  You will be informed of lab results when available.  3)  depending on results  plan follow up . Prescriptions: GLUCOPHAGE XR 500 MG XR24H-TAB (METFORMIN HCL) 2  by mouth once daily  #180 x 3   Entered by:   Romualdo Bolk, CMA (AAMA)   Authorized by:   Madelin Headings MD   Signed by:   Romualdo Bolk, CMA (AAMA) on 04/02/2009   Method used:   Electronically to        [facility] [place] (retail)       [address]       [place], [place]  [address]       Ph: [phone]       Fax: [phone]   RxID:   [id number]

## 2010-03-19 NOTE — Progress Notes (Signed)
Summary: blood sugar is high  Phone Note Call from Patient Call back at [phone] or [phone]   Caller: Wife- Diane Summary of Call: Pt is getting ready to have a [facility] cpx and blood surgar has been running high which has gotten pt nervous. I have left a message for pt's wife to call back on both numbers. Initial call taken by: Romualdo Bolk, CMA Duncan Dull),  August 27, 2009 10:52 AM  Follow-up for Phone Call        Spoke to pt's wife- he has been taking robitussin otc not diabetic version and halls cough drops for a cold. BS has been afternoon 200 and 228. Fasting around 160. This has been like for about 1 week. He has stopped the robitussin and halls last night. BS around 10am around 200. Pt got up at 4am. FBS 130 this am.  Follow-up by: Romualdo Bolk, CMA (AAMA),  August 27, 2009 1:21 PM  Additional Follow-up for Phone Call Additional follow up Details #1::        He is due in next month for labs  anyway    Please see lasttime we were unable to get in touch with him about his labs  and     no follow up call.  Rec we get labs a1c, lipids lfts BMP  and then OV .   Additional Follow-up by: Madelin Headings MD,  August 27, 2009 5:18 PM    Additional Follow-up for Phone Call Additional follow up Details #2::    Pt's wife aware and will see if they can do these labs at [facility] cpx. If not will arrange a time to get it done here. Pt's blood sugar is back down to 115. Follow-up by: Romualdo Bolk, CMA (AAMA),  August 27, 2009 5:27 PM

## 2010-03-19 NOTE — Miscellaneous (Signed)
Summary: labs done for Wellness program at work  Clinical Lists Changes  Observations: Added new observation of TRIGLYCERIDE: 249 mg/dL (38/75/6433 29:51) Added new observation of HDL: 27 mg/dL (88/41/6606 30:16)      -  Date:  09/01/2009    HDL 27    TG 249

## 2010-03-19 NOTE — Progress Notes (Signed)
Summary: Pt just picked up refill for Glucophage. Does he need ov w/ labs  Phone Note Call from Patient Call back at Home Phone [phone]   Caller: spouse-Dianne Summary of Call: Pts wife says that they just picked up refill of Glucophage (Metformin). Pt has been check blood sugar once or twice a day and it is in normal range. Pt is wondering if he needs to sch an appt to get blood work done for further refills.  Initial call taken by: Lucy Antigua,  March 07, 2009 10:22 AM  Follow-up for Phone Call        rec hg a1c, lipids, lfts   urine microalbumin ratio and then rov before end of february .   Follow-up by: Madelin Headings MD,  March 07, 2009 1:48 PM  Additional Follow-up for Phone Call Additional follow up Details #1::        I called pt and sch the above labs and follow up appt  as noted above.  Additional Follow-up by: Lucy Antigua,  March 07, 2009 2:54 PM

## 2010-03-19 NOTE — Assessment & Plan Note (Signed)
Summary: fu on med/nj   Vital Signs:  Patient profile:   50 year old male Height:      70 inches Weight:      230 pounds BMI:     33.12 Pulse rate:   66 / minute BP sitting:   100 / 70  (right arm) Cuff size:   large  Vitals Entered By: Romualdo Bolk, CMA (AAMA) (January 21, 2010 8:15 AM) CC: Follow-up visit on meds, Hypertension Management CBG Result 165   History of Present Illness: Scott Pineda   comes in today  with wife   for follow up of multiple medical problems   His last visit was in Feb of 2011. had lost his job and insurance and thus no follow up . although did have some health assessment labs in the summer. 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  DM:  Getting  100   in am .   higherst pp  160-170.   LIPID: elevated TG  in the 200 down from the 500s  HTNo prob with med  Mood stable on meds    Hypertension History:      He denies headache, chest pain, palpitations, dyspnea with exertion, orthopnea, PND, peripheral edema, visual symptoms, neurologic problems, syncope, and side effects from treatment.  He notes no problems with any antihypertensive medication side effects.        Positive major cardiovascular risk factors include male age 50 years old or older, diabetes, hyperlipidemia, and hypertension.  Negative major cardiovascular risk factors include non-tobacco-user status.      Preventive Screening-Counseling & Management  Alcohol-Tobacco     Alcohol drinks/day: 0     Smoking Status: never     Passive Smoke Exposure: no  Caffeine-Diet-Exercise     Caffeine use/day: 1-2 cup      Does Patient Exercise: yes  Current Medications (verified): 1)  Onetouch Ultra Test  Strp (Glucose Blood) .... One Touch Ultra 2 Test Strips. Pt Uses Two Times A Day or As Directed 2)  Prinivil 5 Mg Tabs (Lisinopril) .... Take 2  Tablet By Mouth Once A Day. 3)  Zoloft 100 Mg Tabs (Sertraline Hcl) .... Take 1 By Mouth Once  Daily 4)  Glucophage Xr 500 Mg Xr24h-Tab (Metformin Hcl) .... 2  By Mouth Once Daily 5)  Apple Cider Vinegar  Tabs (Misc Natural Products) .... 3 By Mouth Two Times A Day  Allergies (verified): 1)  Motrin (Ibuprofen)  Past History:  Care Management: Psychiatry: Christen Bame eppes   ENT:  Annitta Jersey ? 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 hhof 4  9 dogs dod had puppies  8-10 hours per night   sleep works  Nurse, mental health.   Review of Systems  The patient denies anorexia, fever, weight loss, weight gain, vision loss, chest pain, syncope, dyspnea on exertion, prolonged cough, abdominal pain, melena, hematochezia, severe indigestion/heartburn, transient blindness, difficulty walking, abnormal bleeding, enlarged lymph nodes, and angioedema.         had gi illness  a month or so ago  totally resolved.   Physical Exam  General:  Well-developed,well-nourished,in no acute distress; alert,appropriate and cooperative throughout examination Head:  normocephalic and atraumatic.   Eyes:  vision grossly intact.  glasses  Ears:  R ear normal and L ear normal.   Mouth:  some teeth missing  Neck:  No deformities, masses, or tenderness noted. no bruits  Lungs:  Normal respiratory effort, chest expands symmetrically. Lungs are clear to auscultation, no crackles or wheezes.no dullness.   Heart:  Normal rate and regular rhythm. S1 and S2 normal without gallop, murmur, click, rub or other extra sounds.no lifts.   Abdomen:  Bowel sounds positive,abdomen soft and non-tender without masses, organomegaly or  s noted. Msk:  no joint warmth and no redness over joints.   Pulses:  pulses intact without delay   Neurologic:  alert & oriented X3 and gait normal.  no gros deficits  Skin:  turgor normal, color normal, no ecchymoses, and no petechiae.     Cervical Nodes:  No lymphadenopathy noted Psych:  Oriented X3, good eye contact, not anxious appearing, and not depressed appearing.     Impression & Recommendations:  Problem # 1:  DIABETES-TYPE 2 (ICD-250.00) due for a1c   by report  bg generally in range  His updated medication list for this problem includes:    Prinivil 5 Mg Tabs (Lisinopril) .Marland Kitchen... Take 2  tablet by mouth once a day.    Glucophage Xr 500 Mg Xr24h-tab (Metformin hcl) .Marland Kitchen... 2  by mouth once daily  Orders: TLB-BMP (Basic Metabolic Panel-BMET) (80048-METABOL) TLB-A1C / Hgb A1C (Glycohemoglobin) (83036-A1C) TLB-Microalbumin/Creat Ratio, Urine (82043-MALB) Specimen Handling (11914) Venipuncture (78295)  Labs Reviewed: Creat: 0.9 (04/02/2009)     Last Eye Exam: normal (02/18/2008) Reviewed HgBA1c results: 6.5 (04/02/2009)  6.8 (10/09/2008)  Problem # 2:  HYPERTENSION (ICD-401.9)  His updated medication list for this problem includes:    Prinivil 5 Mg Tabs (Lisinopril) .Marland Kitchen... Take 2  tablet by mouth once a day.  Orders: TLB-CBC Platelet - w/Differential (85025-CBCD) Specimen Handling (62130) Venipuncture (86578)  BP today: 100/70 Prior BP: 112/79 (04/02/2009)  Prior 10 Yr Risk Heart Disease: Not enough information (08/14/2008)  Labs Reviewed: K+: 3.6 (04/02/2009) Creat: : 0.9 (04/02/2009)   Chol: 189 (04/02/2009)   HDL: 27 (09/01/2009)   LDL: DEL (05/03/2007)   TG: 249 (09/01/2009)  Problem # 3:  HYPERLIPIDEMIA (ICD-272.4) due for check  trying to avoid the statins because  of hx of  prolonged episodes of  muscle weakness and myalgia.  and falling in the past.  rec increase exercise outside of job  Orders: TLB-Lipid Panel (80061-LIPID) TLB-Hepatic/Liver Function Pnl (80076-HEPATIC) TLB-TSH (Thyroid Stimulating Hormone) (84443-TSH) Specimen Handling (46962) Venipuncture (95284)  Labs Reviewed: SGOT: 25 (08/14/2008)   SGPT: 32 (08/14/2008)  Prior 10 Yr Risk Heart Disease: Not enough information  (08/14/2008)   HDL:27 (09/01/2009), 33.10 (04/02/2009)  LDL:DEL (05/03/2007), DEL (11/02/2006)  Chol:189 (04/02/2009), 195 (08/14/2008)  Trig:249 (09/01/2009), 363.0 (04/02/2009)  Problem # 4:  DEPRESSION (ICD-311) stable on meds  His updated medication list for this problem includes:    Zoloft 100 Mg Tabs (Sertraline hcl) .Marland Kitchen... Take 1 by mouth once daily  Complete Medication List: 1)  Onetouch Ultra Test Strp (Glucose blood) .... One touch ultra 2 test strips. pt uses two times a day or as directed 2)  Prinivil 5 Mg Tabs (Lisinopril) .... Take 2  tablet by mouth once a day. 3)  Zoloft 100 Mg Tabs (Sertraline hcl) .... Take 1 by mouth once daily 4)  Glucophage Xr 500 Mg Xr24h-tab (Metformin hcl) .... 2  by mouth once daily 5)  Apple Cider Vinegar Tabs (Misc natural products) .... 3 by mouth two times  a day  Other Orders: Capillary Blood Glucose/CBG ([address]) Flu Vaccine 45yrs + (60454) Admin 1st Vaccine (09811)  Hypertension Assessment/Plan:      The patient's hypertensive risk group is category C: Target organ damage and/or diabetes.  Today's blood pressure is 100/70.  His blood pressure goal is < 140/90.  Patient Instructions: 1)  You will be informed of lab results when available.  2)  them make plan. 3)  consider increasing metformin adding welchol   for lipids .  4)  ROv depending on results .   Orders Added: 1)  Capillary Blood Glucose/CBG [82948] 2)  TLB-Lipid Panel [80061-LIPID] 3)  TLB-BMP (Basic Metabolic Panel-BMET) [80048-METABOL] 4)  TLB-Hepatic/Liver Function Pnl [80076-HEPATIC] 5)  TLB-TSH (Thyroid Stimulating Hormone) [84443-TSH] 6)  TLB-A1C / Hgb A1C (Glycohemoglobin) [83036-A1C] 7)  TLB-Microalbumin/Creat Ratio, Urine [82043-MALB] 8)  TLB-CBC Platelet - w/Differential [85025-CBCD] 9)  Specimen Handling [99000] 10)  Venipuncture [36415] 11)  Est. Patient Level IV [91478] 12)  Flu Vaccine 47yrs + [90658] 13)  Admin 1st Vaccine [29562]   Immunizations  Administered:  Influenza Vaccine # 1:    Vaccine Type: Fluvax 3+    Site: right deltoid    Mfr: GlaxoSmithKline    Dose: 0.5 ml    Route: IM    Given by: Romualdo Bolk, CMA (AAMA)    Exp. Date: 08/17/2010    Lot #: [id number]  Flu Vaccine Consent Questions:    Do you have a history of severe allergic reactions to this vaccine? no    Any prior history of allergic reactions to egg and/or gelatin? no    Do you have a sensitivity to the preservative Thimersol? no    Do you have a past history of Guillan-Barre Syndrome? no    Do you currently have an acute febrile illness? no    Have you ever had a severe reaction to latex? no    Vaccine information given and explained to patient? yes   Immunizations Administered:  Influenza Vaccine # 1:    Vaccine Type: Fluvax 3+    Site: right deltoid    Mfr: GlaxoSmithKline    Dose: 0.5 ml    Route: IM    Given by: Romualdo Bolk, CMA (AAMA)    Exp. Date: 08/17/2010    Lot #: [id number]

## 2010-03-21 NOTE — Progress Notes (Signed)
Summary: refill  Phone Note From Pharmacy   Caller: [facility] [address]* Reason for Call: Needs renewal Details for Reason: lisinopril 5mg  Initial call taken by: Romualdo Bolk, CMA Duncan Dull),  February 19, 2010 2:56 PM  Follow-up for Phone Call        rx sent to pharmacy Follow-up by: Romualdo Bolk, CMA (AAMA),  February 19, 2010 2:57 PM    Prescriptions: PRINIVIL 5 MG TABS (LISINOPRIL) Take 2  tablet by mouth once a day.  #180 Tablet x 0   Entered by:   Romualdo Bolk, CMA (AAMA)   Authorized by:   Madelin Headings MD   Signed by:   Romualdo Bolk, CMA (AAMA) on 02/19/2010   Method used:   Electronically to        [facility] (retail)       [address]       [place], [place]  [address]       Ph: [phone]       Fax: [phone]   RxID:   [id number]

## 2010-04-22 ENCOUNTER — Telehealth: Payer: Self-pay | Admitting: *Deleted

## 2010-04-22 MED ORDER — LISINOPRIL 5 MG PO TABS: 5.0000 mg | ORAL_TABLET | Freq: Every day | ORAL | Status: DC

## 2010-04-22 NOTE — Telephone Encounter (Signed)
rx sent to pharmacy and note sent to pt saying that he needs a follow up appt before next refill.

## 2010-07-05 NOTE — H&P (Signed)
NAME:  Scott Pineda, SHINAULT NO.:  [id number]   MEDICAL RECORD NO.:  [id number]          PATIENT TYPE:  EMS   LOCATION:  MAJO                         FACILITY:  [facility]   PHYSICIAN:  Gabrielle Dare. [name], M.D.DATE OF BIRTH:  1961/01/29   DATE OF ADMISSION:  03/24/2005  DATE OF DISCHARGE:                                HISTORY & PHYSICAL   CHIEF COMPLAINT:  Confusion status post fall.   HISTORY OF PRESENT ILLNESS:  The patient is a 50 year old white male who was  up on a ladder in his yard taking down some Christmas lights when he fell  approximately 4 feet to the grass.  He had no loss of consciousness but he  was confused at the scene.  Work-up in the emergency department demonstrated  no acute injuries but the patient still complaining of some back and neck  soreness and he is very confused.  We were asked to admit him to the trauma  service.   PAST MEDICAL HISTORY:  1.  Parkinsonism.  He sees Dr. Sandria Manly for this.  2.  Depression.  3.  Panic attacks.  4.  Was previously treated for sarcoidosis.   PAST SURGICAL HISTORY:  1.  Right inguinal hernia repair.  2.  Epigastric hernia repair.  3.  Foot surgery.  4.  Back surgery by Dr. Lovell Sheehan.  5.  Carpal tunnel.  6.  Left upper extremity mass removal by Dr. Amanda Pea.   CURRENT MEDICATIONS:  1.  Parcopa of uncertain dose daily.  2.  Klonopin 25 mg p.o. p.r.n.  3.  Zoloft 100 mg p.o. daily.  4.  Wellbutrin 150 mg p.o. b.i.d.   ALLERGIES:  No known drug allergies.   REVIEW OF SYSTEMS:  Patient is uncooperative with this portion of the  history.   PHYSICAL EXAMINATION:  VITAL SIGNS:  Pulse 94, blood pressure 154/106,  respirations 16, temperature 98.7, saturations 97%.  SKIN:  Warm.  HEENT:  Normocephalic.  Extraocular muscles are intact.  Pupils are equal  and reactive.  They are 3 mm in size.  Tympanic membranes although partially  occluded by cerumen appear clear bilaterally.  NECK:  Posterior midline tenderness,  but no step-off.  Trachea is in the  midline.  LUNGS:  Clear to auscultation.  Respiratory excursion is normal.  HEART:  Regular impulse is palpable in the left chest.  ABDOMEN:  Soft.  There is no significant tenderness.  He has an upper  midline scar and a right inguinal hernia scar.  BACK:  Lumbar tenderness, but no step-offs.  GENITALIA:  Normal external male.  EXTREMITIES:  No focal tenderness.  NEUROLOGIC:  Glasgow coma scale is 14.  He is not oriented.  He has no  memory to the event.  Cranial nerves II-XII are grossly intact to the extent  that he is cooperative with his examination.  Sensation and motor  examination in upper and lower extremities is intact.  Vascular examination  is intact.   LABORATORIES:  Sodium 136, potassium 4, chloride 103, CO2 27, BUN 4,  creatinine 0.9, glucose 95.  White blood cell count 7.3,  hemoglobin 16.1,  platelets 246.   X-RAY STUDIES:  Chest x-ray was negative.  CT scan of the head negative.  CT  scan of the cervical spine is negative.  CT scan of the chest was negative.  CT scan of the abdomen and pelvis was negative.  No acute findings were  found on any CT scan.   IMPRESSION:  50 year old status post fall with mild traumatic brain injury  and multiple medical issues.   PLAN:  Admit him to the trauma service.  We will continue his home  medications and if he continues to have neck pain tomorrow morning we will  check flex/ex studies.  In the interim we will place him in a [place] J collar  and give him some pain medications.      Gabrielle Dare [name], M.D.  Electronically Signed     BET/MEDQ  D:  03/24/2005  T:  03/25/2005  Job:  [id number]   cc:   Genene Churn. Love, M.D.  Fax: [phone]

## 2010-07-05 NOTE — Discharge Summary (Signed)
NAME:  Scott Pineda, Scott Pineda NO.:  [id number]   MEDICAL RECORD NO.:  [id number]          PATIENT TYPE:  INP   LOCATION:  2013                         FACILITY:  [facility]   PHYSICIAN:  Corwin Levins, M.D. LHCDATE OF BIRTH:  01/21/1961   DATE OF ADMISSION:  06/25/2004  DATE OF DISCHARGE:  06/29/2004                                 DISCHARGE SUMMARY   DISCHARGE DIAGNOSES:  1.  Dizziness, probable cerebellar in nature.  2.  Depression/anxiety.  3.  Hypertension.  4.  History of sarcoidosis.  5.  Chronic low back pain.  6.  Parkinsonism.  7.  Hypertension.  8.  Gastroesophageal reflux disease.  9.  Gait disorder.  10. Essential tremor.   CONSULTS:  Neurology, Dr. Sandria Manly, Jun 26, 2004.   PROCEDURES:  1.  MRI of the brain Jun 26, 2004, negative.  2.  MRI cervical spine Jun 26, 2004, mild cervical degenerative changes with      spondylosis C3 C4 bilaterally and C5-6 on the left.   HISTORY AND PHYSICAL:  See that dictated day of admission Jun 25, 2004, per  Dr. Everardo All.   HOSPITAL COURSE:  Mr. Lawhorn is a 50 year old white male __________ well  after four days of hospitalization involving severe vertigo, chest  discomfort uncertain etiology.  Initial CPK evaluation and telemetry was  negative.  Neurology consultation obtained with MRI as above.  Dizziness  felt likely cerebellar in nature.  He was seen by physical therapy and  occupational therapy.  He was getting home physical therapy prior to  admission and this should be discontinued.   MRI RESULTS:  As above.   LAB RESULTS:  Showed ANA positive as well as cardiolipin antibody IgM  positive, suggestive of autoimmune abnormality.  Further labs including anti-  DNA, ACE level, rheumatoid factor, paraneoplastic panel and metanephrines  are pending at time of discharge.  As he was ambulatory, eating well with  minimal symptoms on current medications, he was felt to have gained maximal  benefit from this hospitalization and  was to be discharged home.   DISPOSITION:  Discharged home in good condition to resume home medications  and physical therapy.   DISCHARGE MEDICATIONS:  To include Zoloft, Klonopin, Wellbutrin, Valium,  Vicodin, Zestril and PARCOPA 25/100 one sublingual daily, all per previous  dosing.   He is to followup with Dr. __________ in 1-2 weeks.  There are no other  further activity or dietary restrictions.      JWJ/MEDQ  D:  06/29/2004  T:  06/30/2004  Job:  [id number]

## 2010-07-05 NOTE — H&P (Signed)
NAME:  Scott Pineda, Scott Pineda NO.:  [id number]   MEDICAL RECORD NO.:  [id number]          PATIENT TYPE:  INP   LOCATION:  3028                         FACILITY:  [facility]   PHYSICIAN:  Gordy Savers, M.D. LHCDATE OF BIRTH:  [birth year]   DATE OF ADMISSION:  04/22/2005  DATE OF DISCHARGE:                                HISTORY & PHYSICAL   CHIEF COMPLAINT:  Confusion.   HISTORY OF PRESENT ILLNESS:  The patient is a 50 year old white male with a  history of parkinsonism and gait instability.  He was last admitted to the  hospital on March 28, 2005, through March 31, 2005, following a  traumatic brain injury when he fell from a ladder.  The patient was stable  until today when he fell down 12 to 15 stairs, apparently striking his head  on a concrete wall at the base of the stairs.  The fall was hard by his wife  who stated that he was unconscious for 60 seconds which was timed.  He  eventually was able to open his eyes but was nonverbal for several  additional minutes.  He was transported to the emergency department for  evaluation.  Multiple radiographs were negative for acute fracture  dislocation.  Head CT was negative. In the emergency department, he became  quite agitated and combative and required parenteral Ativan as well as four-  point restraints.  The patient is now admitted for further evaluation and  treatment of his post concussive syndrome.   PAST MEDICAL HISTORY:  1.  The patient has a history of parkinsonism diagnosed in 2006.  He has      been followed by Dr. Sandria Manly.  2.  As mentioned, last month he was hospitalized for a traumatic brain      injury requiring rehab.  3.  He has had a number of operations including a left carpal tunnel      release, inguinal hernia surgery, midline abdominal surgery.  4.  In 2000, he had an L5-S1 fusion, additional lumbar surgery in 2006.  5.  He has a history of pulmonary sarcoid.  6.  There is a history of  depression and panic disorder.  7.  He has had cataract surgery involving the left eye.   MEDICAL REGIMEN:  1.  Sinemet CR 25/100 one daily.  2.  Wellbutrin SR 150 twice daily.  3.  Ambien 5 mg at bedtime p.r.n.  4.  Skelaxin 800 mg 3 times a day p.r.n.  5.  Celebrex 200 mg daily.  6.  Neurontin 100 mg twice daily.  7.  Klonopin 0.5 mg 3 times a day p.r.n.  8.  Oxycodone IR 5 every 4 to 6 hours p.r.n. pain.   SOCIAL HISTORY:  Please see prior note.  He is self employed, works part  time as a Administrator.  He is accompanied by his wife.  They have 3 children.   FAMILY HISTORY:  The patient is adopted.   PHYSICAL EXAMINATION:  VITAL SIGNS: Blood pressure 150/90, pulse rate 72.  GENERAL: Well-developed male who is alert with normal speech. At  the time of  my examination, he was in four-point restraints but calm and answered  questions appropriately.  HEAD AND NECK:  Exam reveals some soft tissue swelling about the left ear.  His right front tooth was quite loose.  ENT otherwise unremarkable.  No neck  vein distention or bruits.  CHEST: Clear anterolaterally.  CARDIOVASCULAR: Normal heart sounds, no murmurs.  ABDOMEN:  Revealed a well-healed midline scar.  Moderately obese, soft,  nontender.  No organomegaly.  EXTREMITIES:  Negative.  There is no edema.  The right dorsalis pedis pulse  was diminished.  NEUROLOGIC: The patient was alert cooperative.  He was unable to name the  name of his wife or even identify her as his wife.  He was not oriented to  place or time, was not able to name the year.  He had no recollection for  any events.  When asked simple questions such as the name of his children,  he stated he did not have any children.  Motor exam was limited due to the  four-point restraints, but he was able to move all extremities to command.  Plantar responses were flexor.   IMPRESSION:  1.  Traumatic brain injury with post concussive amnesia, confusion.  2.  Parkinsonian  syndrome.   DISPOSITION:  1.  Will admit the patient to the hospital and clinically observe.  2.  Will continue his preadmission medications and hope his mental status      will clear.  3.  The patient will be ambulated.  4.  Depending on his clinical status, he may require further comprehensive      inpatient rehabilitation.           ______________________________  Gordy Savers, M.D. [facility]     PFK/MEDQ  D:  04/22/2005  T:  04/22/2005  Job:  [id number]

## 2010-07-05 NOTE — Discharge Summary (Signed)
NAME:  Scott Pineda, VARMA NO.:  [id number]   MEDICAL RECORD NO.:  [id number]          PATIENT TYPE:  INP   LOCATION:  3039                         FACILITY:  [facility]   PHYSICIAN:  Cristi Loron, M.D.DATE OF BIRTH:  12/13/1960   DATE OF ADMISSION:  05/05/2004  DATE OF DISCHARGE:  05/14/2004                                 DISCHARGE SUMMARY   BRIEF HISTORY:  The patient is a 50 year old white male, who presented to  the emergency department complaining of severe back pain and leg weakness.  He was admitted by Dr. Newell Coral.   For further details of this admission, please refer to history and physical.   HOSPITAL COURSE:  The patient was admitted with intractable back pain and  leg weakness.  From the onset, we suspected this was a conversion or  psychological disorder.  However, in order to rule out a physical problem,  we got a lumbar spine MRI which demonstrated no significant problems and a  thoracic spine MRI which demonstrated only some mild degenerative changes  without significant neural compression.  He went on to have a C-spine MRI  which demonstrated some mild to moderate spondylosis, but without any  significant spinal cord compression to explain his leg weakness.  He went on  also to have a CT scan because he complained of some slurred speech.  The CT  scan was unremarkable/normal.  We had a psychiatrist see the patient.  We  also had rehab team to see the patient.  They did not think he needed  inpatient rehab and recommended outpatient therapy.  We arranged this and by  May 14, 2004, the patient was feeling well enough and was discharged home.   DISCHARGE INSTRUCTIONS:  The patient was given written discharge  instructions and was instructed to follow up with me in about four weeks.   DISCHARGE PRESCRIPTIONS:  1. Lortab 10, #50, 1 p.o. q.4h. p.r.n. for pain.  2. Flexeril 10 mg, #50, 1 p.o. t.i.d. p.r.n. muscle spasms.   The patient was also  instructed to follow up with his psychiatrist.   FINAL DIAGNOSES:  1. Back pain.  2. Panic disorder.  3. Depression.     PROCEDURE PERFORMED:  None.      JDJ/MEDQ  D:  07/11/2004  T:  07/12/2004  Job:  [id number]

## 2010-07-05 NOTE — H&P (Signed)
NAME:  Scott Pineda, Scott Pineda NO.:  [id number]   MEDICAL RECORD NO.:  [id number]          PATIENT TYPE:  INP   LOCATION:                               FACILITY:  [facility]   PHYSICIAN:  Hewitt Shorts, M.D.DATE OF BIRTH:  06/12/1960   DATE OF ADMISSION:  05/06/2004  DATE OF DISCHARGE:                                HISTORY & PHYSICAL   HISTORY OF PRESENT ILLNESS:  The patient is a 50 year old right-handed white  male who has been under the care of Dr. Delma Officer since January of 2000.  He is status post an L5-S1 posterior lumbar interbody fusion with ray cages  in February of 2000.  He has been undergoing evaluation by Dr. Lovell Sheehan over  the past month.   Patient was brought to the [facility] Emergency Room this  evening by EMS after his lower extremities gave away and he was unable to  get up.   Patient was evaluated by Dr. Carleene Cooper, the emergency room physician.  X-  rays of the cervical spine and lumbar spine were obtained and interpreted by  Dr. Mayra Neer. Eppie Gibson, being unremarkable other than for his previous L5-S1 ray  cage posterior lumbar interbody fusion.   Symptomatically patient is complaining of midline lower lumbar back pain.   The emergency room staff attempted to get the patient up and his lower  extremities gave way again and neurosurgery consultation was requested.   Patient and his wife note that he has had a history of depression dating  back 10 years.  He had an episode of depression 10 years ago and was treated  with medications stopping them about four or five years ago, but had another  bad episode of depression that began about two months ago that is associated  with panic attacks.  He is under the care of Dr. Milagros Evener.  He is on  Zoloft 100 mg q.a.m., Klonopin 0.5 mg three to four times a day, Wellbutrin  XL 150 mg q.a.m., and Rozerem 8 mg q.h.s. for sleep.  Patient been given  samples of this by Dr. Evelene Croon.   Patient has also been undergoing evaluation by Dr. Lovell Sheehan at his office  beginning about three and a half weeks ago on April 12, 2004.  Dr.  Lovell Sheehan had obtained an MRI scan of the lumbar spine which he felt was  unremarkable other than for the previous fusion.  He had referred the  patient for physical therapy.  Patient and his wife note that this past week  the physical therapist felt that he was getting weaker rather than stronger  and felt that he needed to return to Dr. Lovell Sheehan for reevaluation.  Patient  contacted Dr. Lovell Sheehan' office.  Dr. Lovell Sheehan was out of the office on  vacation and Dr. Channing Mutters reviewed the chart and requested an MRI of the thoracic  spine and the patient is scheduled for follow-up tomorrow in the office with  Dr. Lovell Sheehan.  The results of that thoracic MRI are not available to me at  this time.   PAST MEDICAL HISTORY:  1.  Notable for his history of depression and panic attacks as described      above.  2.  Also notable for history of hypertension which is treated.  3.  Sarcoid which is not currently treated, but which has been treated in      the past by Dr. Fannie Knee.  4.  No history of myocardial infarction, cancer, stroke, peptic ulcer      disease, or diabetes.   PAST SURGICAL HISTORY:  1.  L5-S1 ray cage posterior lumbar interbody fusion.  2.  Hiatal hernia surgery by Dr. Luan Pulling.  3.  Right inguinal herniorrhaphy.  4.  Left carpal tunnel release at which time they also removed a knot from      his left arm.  5.  Right foot surgery for injury sustained by a nail gun.   ALLERGIES:  Denies.   CURRENT MEDICATIONS:  1.  Zoloft 100 mg q.a.m.  2.  Klonopin 0.5 mg three to four times a day.  3.  Wellbutrin XL 150 mg q.a.m.  4.  Valium 5 mg q.6h. p.r.n. prescribed by Dr. Lovell Sheehan.  5.  Vicodin 10/500 q.4h. p.r.n. prescribed by Dr. Lovell Sheehan.  6.  Lisinopril 40 mg q.a.m. for hypertension prescribed by Dr. Fabian Sharp.  7.  Rozerem 8 mg q.h.s. prescribed by  Dr. Evelene Croon.   FAMILY HISTORY:  Patient was adopted and has no medical information  regarding his birth parents.   SOCIAL HISTORY:  Patient is married.  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  He has not worked, though, for the past month.  He does not smoke or drink  alcoholic beverages.   REVIEW OF SYSTEMS:  Notable for history described in his history of present  illness and past medical history, but is otherwise unremarkable.   PHYSICAL EXAMINATION:  GENERAL:  Patient is a well-developed, well-nourished  white male in no acute distress.  VITAL SIGNS:  Temperature 98.2, pulse 83, blood pressure 113/77, respiratory  rate 20.  LUNGS:  Clear to auscultation.  He has symmetrical respiratory excursion.  HEART:  Regular rate and rhythm with S1, S2.  There is no murmur.  ABDOMEN:  Soft, nondistended.  Bowel sounds are present.  EXTREMITIES:  No clubbing, cyanosis, edema.  MUSCULOSKELETAL:  Mild tenderness to palpation from thoracolumbar junction  region to the lower lumbar region without any specific point tenderness and  there is no tenderness at all to the neck nor upper or mid thoracic region.  Motor examination shows 5/5 strength to the lower extremities including the  iliopsoas, quadriceps, dorsiflexor, plantar flexor, extensor hallux longus  but he has a tendency to give away and his strength is much more vigorous  when indirectly tested.  Sensation diminished to pin prick in the left foot,  but is otherwise intact to the lower extremities as well as to the torso  including the chest and the abdomen.  There is no evidence of a sensory  level.  Reflexes:  The left biceps, brachioradialis, and triceps are 1.  The  right biceps, brachioradialis, and triceps are trace.  The quadriceps are 1-  2 bilaterally.  The left gastrocnemius is minimal.  The right is 1-2.  Toes  are downgoing bilaterally.  Gait and stance were not tested.  IMPRESSION:  Patient with low back pain who has had numerous  episodes of his  lower extremities giving away over the past month including this past  evening and again here in the emergency room.  However, his strength  is  good.  He has no sensory level.  X-rays of his cervical and lumbar spine are  unremarkable.  MRI of the lumbar spine according to Dr. Lovell Sheehan' records is  unremarkable and MRI scan of the thoracic spine was done last week and the  results are pending.   PLAN:  Patient will be admitted to the service of Dr. Delma Officer for  further evaluation and care.  Dr. Lovell Sheehan will review the thoracic MRI from  last week and continue ongoing evaluation and treatment.  Admission orders  have been written.      RWN/MEDQ  D:  05/06/2004  T:  05/06/2004  Job:  [id number]

## 2010-07-05 NOTE — H&P (Signed)
NAME:  Scott Pineda, CRILL NO.:  [id number]   MEDICAL RECORD NO.:  [id number]          PATIENT TYPE:  IPS   LOCATION:  4033                         FACILITY:  [facility]   PHYSICIAN:  Ranelle Oyster, M.D.DATE OF BIRTH:  1960/06/01   DATE OF ADMISSION:  03/28/2005  DATE OF DISCHARGE:                                HISTORY & PHYSICAL   CHIEF COMPLAINT:  Pain and decreased balance.   HISTORY OF PRESENT ILLNESS:  This is a 50 year old white male diagnosed with  Parkinson's disease last year who was on a ladder on February 5 when the  ladder fell and he sustained a fall with loss of consciousness.  CT of the  head and C-spine was negative.  The patient had ongoing low back pain and  problems with his balance and associated spasms.  The patient does have a  history in 2006 of lumbar surgery with apparent ray cage fusion performed by  Dr. Delma Officer.  The patient had been doing fairly well with the surgery  until the fall.  The patient continued to have problems with balance,  decreased strength and back pain and was admitted to inpatient rehab for  further functional therapy.   REVIEW OF SYSTEMS:  Positive for neck pain, rib pain, low back pain,  weakness, pain radiating into the legs, decreased sleep, anxiety and  depression.   PAST MEDICAL HISTORY:  1.  Positive for carpal tunnel release.  2.  Left cataract.  3.  Excision of sarcoidosis in the lung.  4.  Lumbar surgery in 2006 as mentioned above.  5.  Parkinsonism diagnosed in 2006.  6.  The patient has had some residual right-sided weakness over the last      year.  7.  The patient also has panic attacks and depression.  8.  History of orthostasis.  9.  Essential tremor.   FAMILY HISTORY:  Noncontributory.   SOCIAL HISTORY:  The patient is married, self-employed in the lawn care  business.  He works with his son.  He was independent prior to arrival.  He  does not smoke or drink.  He lives with a wife who is  supportive.   MEDICATIONS PRIOR TO ARRIVAL:  1.  Include Wellbutrin SR 150 mg b.i.d.  2.  Sinemet CR 25/100 daily.  3.  Zoloft 100 mg daily.  4.  Klonopin 0.5 mg p.r.n.   ALLERGIES:  NO KNOWN DRUG ALLERGIES.   LABS:  Include hemoglobin 16.1, white count 7.3, platelets 246,000.  Sodium  136, potassium 4, BUN and creatinine 4 and 0.9.   PHYSICAL EXAM:  Blood pressure 137/87, pulse 86, respiratory rate 20,  temperature 98.2.  The patient is pleasant in no acute distress.  He is  alert and oriented x 3 with extra time.  EAR, NOSE AND THROAT:  Exam was unremarkable today.  NECK:  Supple without JVD, lymphadenopathy.  CHEST:  Clear to auscultation bilaterally without wheezes, rales or rhonchi.  HEART:  Regular rate and rhythm without murmurs, rubs or gallops.  He was  slightly tachycardic.  ABDOMEN:  Soft, nontender, bowel sounds  were positive.  NEUROLOGIC:  The patient had intact cranial nerve exam.  Reflexes are 1+.  Sensation was normal.  He had a mild essential and intentional tremor of the  left hand.  Minimal tremor seen on the right upper extremity.  No  cogwheeling or rigidity was seen.  He had good flexibility throughout the  upper and lower extremities today.  Strength was 4+ to 5/5 on the right  side.  Left side strength was 3+ to 4/5 throughout and consistently so.  Cognitively, the patient's judgment was fair.  Memory was appropriate.  He  answered several complicated questions and had good remote recall.  Speech  was slightly delayed and dysarthric but minimally so.  He is easily  intelligible.  SKIN:  He had no obvious breakdown.  Low back was painful to palpation over  the lumbar spine at L4 and L5 centrally and over the facets left greater  than right.  Higher lumbar spine and thoracic spine were nontender today.  The patient did have some tenderness over the upper trapezius,  sternocleidomastoid muscles, left more so than right today.  The patient had  inhibited  flexion due to pain as well as lateral bending on either side when  examining the neck today.   ASSESSMENT AND PLAN:  1.  Functional deficits secondary to fall with post-concussion syndrome in      the setting of Parkinson's disease.  Patient with preexisting left-sided      weakness and balance problems as well as orthostasis which have now      increased.  Pain has increased substantially as well.  The patient does      have a history of lumbar back fusion.  Begin inpatient comprehensive      inpatient rehab with physical therapy to address mobility, lower      extremity strength and balance, OT to assess ADLs, upper extremity use.      Speech will follow cognition and articulation.  Nursing will monitor the      patient for pain, bowel and bladder, and skin issues.  Social      Investment banker, operational will assess for psychosocial needs.  Estimated      length of stay is 10 days.  Prognosis fair to good.  2.  Pain management.  P.r.n. oxycodone.  Will schedule Skelaxin.  Consider      trigger point injections to the neck.  The patient also would benefit      from ice and heat to the neck as well.  3.  DVT prophylaxis.  Consider subcutaneous Lovenox.  4.  Low back pain.  Will order an MRI of the lumbar spine without contrast      to discern any disruption of his ray cage fusion.  The patient has      radicular symptoms by history.  Will start low-dose Celebrex as well as      Neurontin to assist with pain control.  5.  Chronic dizziness.  Likely due to orthostasis which is predominant in      Parkinson's patients.  Will check daily orthostatic blood pressure and      pulse.  Vestibular evaluation is also warranted as this could likely be      a manifestation of post-concussion syndrome.      Ranelle Oyster, M.D.  Electronically Signed     ZTS/MEDQ  D:  03/28/2005  T:  03/29/2005  Job:  [id number]

## 2010-07-05 NOTE — Consult Note (Signed)
NAME:  JERRARD, BRADBURN NO.:  [id number]   MEDICAL RECORD NO.:  [id number]          PATIENT TYPE:  INP   LOCATION:  3028                         FACILITY:  [facility]   PHYSICIAN:  Casimiro Needle L. Reynolds, M.D.DATE OF BIRTH:  Nov 17, 1960   DATE OF CONSULTATION:  04/22/2005  DATE OF DISCHARGE:                                   CONSULTATION   REQUESTING PHYSICIAN:  Rene Paci, M.D.   REASON FOR EVALUATION:  Confusion after fall.   HISTORY OF PRESENT ILLNESS:  This is an inpatient consultation evaluation of  an existing [facility] patient, a 50 year old man who  has been seen on previous occasions by Dr. Sandria Manly for a gait disorder,  possibly felt to represent some sort of parkinsonian syndrome.  The patient  was admitted on April 22, 2005, after falling down some stairs at home.  His  wife says he fell down about 12 or 15 stairs, fell to the bottom and his  back and the back of his head against the cinder block well.  According to  his wife's report, he was unconscious for a minute to a minute and a half  before he began to gradually come around.  He was very confused, however,  until he got to the emergency room and reportedly had to be physically  restrained in the ER.  Since arriving to the floor, he has also been  intermittently confused.  His wife said that he will fall asleep, wake up  and not know where is and then become very agitated.  This happened a couple  of times this afternoon on the [address] ward, and he required medication  including Ativan and Haldol to make him calm.  However, other times he seems  okay.  This evening he is alert and pleasant.  His wife notes that he had  similar problems after he suffered a fall off a ladder last month.  She also  notes that the last six months or so he has had more difficulty with his  gait.  The patient himself notes that he has had more difficulty getting out  of chairs and is slower walking around;  however, although he relates a lot  of this to orthostatic symptoms, his wife notes that this problem has been  particularly pronounced in the last month since he fell off the ladder.  A  neurologic opinion is requested regarding his confusional episodes.   PAST MEDICAL HISTORY:  Remarkable for the falls as above.  He also has a  diagnosis of some sort of parkinsonian syndrome, for which he has been seen  by Dr. Sandria Manly.  He had an L5-S1 fusion by Dr. Lovell Sheehan in 2000.  He has  previously been diagnosed with sarcoidosis on the past on the basis of  abnormal chest imaging and ultimately a biopsy.  He was treated with  steroids for awhile but this has been discontinued, mostly because of  psychiatric issues.  He has a long history of depression and panic disorder,  for which he sees Dr. Evelene Croon.   FAMILY/SOCIAL/REVIEW OF SYSTEMS:  As outlined  in the admission H&P of April 22, 2005, by Dr. Amador Cunas.   MEDICATIONS:  1.  Sinemet CR 25/100 mg daily.  2.  Wellbutrin SR 150 mg b.i.d.  3.  Ambien p.r.n.  4.  Celebrex 200 mg daily.  5.  Neurontin 100 mg b.i.d.  6.  Klonopin 0.5 mg t.i.d. p.r.n.  7.  Oxycodone p.r.n.   PHYSICAL EXAMINATION:  VITAL SIGNS:  Temperature 98.2, blood pressure  124/80, pulse 96, respirations 20, O2 saturation 95% on room air.  GENERAL:  This is a healthy-appearing man who seems in no distress.  HEENT:  Cranium is normocephalic and atraumatic.  Oropharynx benign.  NECK:  Supple without carotid bruits.  CARDIAC:  Regular rate and rhythm without murmurs.  NEUROLOGIC:  Mental status:  He is awake and alert.  He is oriented to the  hospital but cannot tell me what city the hospital is in and tells me the  year is 2005.  He is able to name objects and repeat simple phrases and is  able to follow one and two-step commands.  However, he tends to have some  slowness in his speech and his responses.  Cranial nerves:  Pupils equal and  reactive.  Extraocular movements full  without nystagmus.  Visual fields full  to confrontation.  Face, tongue and palate move normally and symmetric with  some slowness of facial movements and paucity of facial expression.  Motor:  Normal bulk and tone with absolutely no cogwheeling.  He has some give-away  weakness in the upper greater than lower extremities throughout, but I think  his physiologic strength is actually full everywhere.  Sensation intact to  light touch throughout.  Reflexes 2+ and symmetric.  Toes are downgoing  bilaterally.  Cerebellar:  Finger-to-nose is performed adequately.  Gait:  He is sitting on the side of the bed unassisted.  He is able to stand for a  couple of seconds but then falls back down on the bed because of orthostatic  sensations.   LABORATORY REVIEW:  He has had no labs here in the hospital during this  admission.  He has also had no neuro imaging.  He did have several films and  CTs, which did not show any acute injury.   IMPRESSION:  1.  Mild traumatic brain injury after fall, 360.7, with resulting confusion      due to the above, possibly also related to medications and his      hospitalized state.  2.  Parkinsonian syndrome diagnosed by Dr. Sandria Manly.  He does not really have      much evidence of this by examination today.  3.  Orthostatic symptoms.  I suspect this is related to postural      hypotension.  The two possible reasons for this could be his      parkinsonian syndrome or his history of sarcoidosis, which may be      causing an autonomic neuropathy.   PLAN:  Will check an EEG for his confusion.  For now I will continue manage  this conservatively with sitters and p.r.n. benzodiazepines.  Neuroleptics  should be avoided given his parkinsonian syndrome.  Will check orthostatic  vital signs.  Agree that a psychiatric evaluation may be helpful.  If he  does have significant orthostatic hypotension, he might benefit from being on fludrocortisone or midodrine.      Michael L.  Thad Ranger, M.D.  Electronically Signed     MLR/MEDQ  D:  04/23/2005  T:  04/24/2005  Job:  [id number]   cc:   Gordy Savers, M.D. [facility]  [address] [place]  [place] [address]   Milagros Evener, M.D.  Fax: [phone]

## 2010-07-05 NOTE — H&P (Signed)
NAME:  Scott Pineda, ASTORINO NO.:  [id number]   MEDICAL RECORD NO.:  [id number]          PATIENT TYPE:  INP   LOCATION:  2013                         FACILITY:  [facility]   PHYSICIAN:  Sean A. Everardo All, M.D. [facility] OF BIRTH:  August 08, 1960   DATE OF ADMISSION:  06/25/2004  DATE OF DISCHARGE:                                HISTORY & PHYSICAL   REASON FOR ADMISSION:  Dizziness and chest pain.   HISTORY OF THE PRESENT ILLNESS:  The patient is a 50 year old man with  several months of vertiginous quality dizziness.  He has associated pain at  the left anterior chest as well as the left upper arm.  He also has headache  and associated diaphoresis.  He states that these symptoms are not related  to the context of anxiety.   PAST MEDICAL HISTORY:  1.  Depression/anxiety.  2.  Hypertension.  3.  The patient was apparently diagnosed with sarcoidosis on bronchoscopy.  4.  Chronic back pain.  5.  The patient's wife states that he was recently seen at [facility] and      diagnosed with parkinsonism.   MEDICATIONS:  Zoloft, Klonopin, Wellbutrin, valium, Vicodin, Zestril, and  parcopa. The parcopa is 25/100, 1 sublingual tablet daily.  Dosages of the  other medications are uncertain.   SOCIAL HISTORY:  The patient is married.  His wife is here.  He works as a  Pharmacologist.   FAMILY HISTORY:  The patient is adopted.   REVIEW OF SYSTEMS:  The patient denies the following:  Nausea, vomiting,  loss of consciousness, urinary hesitancy, incontinence, seizure, rectal  bleeding, hematuria, abdominal pain, and dysuria.   PHYSICAL EXAMINATION:  VITAL SIGNS:  Blood pressure is 127/91, heart rate is  97, respiratory rate is 17 and the patient is afebrile.  GENERAL APPEARANCE:  The patient is anxious and in no distress.  SKIN:  The skin is not diaphoretic.  HEENT:  Head is atraumatic.  Sclerae nonicteric.  No periorbital swelling.  No proptosis.  Pharynx; mucous membranes are dry.  NECK:   The neck is supple.  CHEST:  The chest is clear to auscultation.  HEART:  Cardiovascular - no JVD.  No edema.  Regular rate and rhythm.  No  murmur.  Pedal pulses are intact.  ABDOMEN:  The abdomen is soft, obese and nontender.  No hepatosplenomegaly.  No masses.  GENITALIA AND RECTAL:  The genital and rectal examinations are not done at  this time due to the patient's condition.  EXTREMITIES:  No deformities are seen.  NEUROLOGIC:  Alert and well-oriented.  Cranial nerves are intact  bilaterally.  Motor function is diffusely intact, but subjectively and  diffusely reduced from normal.  Sensation is diffusely intact to touch.   LABORATORY STUDIES:  Creatinine 0.8.  CPK, MB and troponin are undetectable.  EKG; left axis deviation.  Head CT; no acute findings.   IMPRESSION:  1.  Vertiginous dizziness of uncertain etiology.  2.  Chest pain of uncertain etiology.  3.  A complex medical history as described above.   PLAN:  1.  Check  CPKs.  2.  Symptomatic therapy.  3.  I discussed Code Status with the patient and his wife and they      requested Full Code;  however, he would not want to be started nor      maintained on artificial life support if there is not a reasonable      chance of a functional recovery.  4.  Continue outpatient medications at empiric dosages as his dosages are      uncertain.  5.  After CPKs are negative he will most likely require neurologic      consultation.      SAE/MEDQ  D:  06/25/2004  T:  06/26/2004  Job:  [id number]   cc:   Neta Mends. Fabian Sharp, M.D. [facility]

## 2010-07-05 NOTE — Consult Note (Signed)
NAME:  [name], [name] NO.:  [id number]   MEDICAL RECORD NO.:  [id number]          PATIENT TYPE:  INP   LOCATION:  2013                         FACILITY:  [facility]   PHYSICIAN:  Genene Churn. Love, M.D.    DATE OF BIRTH:  1960/06/27   DATE OF CONSULTATION:  06/26/2004  DATE OF DISCHARGE:                                   CONSULTATION   REASON FOR CONSULTATION:  This 50 year old right handed white married male  is seen for evaluation of vertigo with a history of low back pain.   HISTORY OF PRESENT ILLNESS:  [name] had surgery for low back pain with an  L5-S1 fusion by Dr. Lovell Sheehan in the year 2000.  Subsequently in January and  February of 2006, he developed left sided weakness involving his arm and his  leg.  This probably started as early as December 2005.  He will, at times,  drag his left foot and leg.  He had developed dysarthria, slurred speech,  was able to understand what was going on but had difficulty getting his  words out.  He also began developing difficulty with vertigo characterized  by a spinning sensation occurring when he would change positions such as  sitting to standing lasting anywhere from 15 to 30 minutes.  There was  nausea but no vomiting.  At one point, he was unable to walk and has been  using a walker since February 2006.  He was seen by Dr. Lovell Sheehan in March  2006 when he was admitted after a fall in his yard.  At that time, he had an  MRI of the cervical spine which showed some mild degenerative disc changes.  In March 2006, he had an MRI of the brain that showed evidence of  enlargement of the subarachnoid spaces in the cerebellar region and a large  posterior fossa but no other focal abnormalities.  One week prior to  admission, he was seen by Dr. Selena Batten in [place], a neurologist, who felt  that he had parkinsonism and placed him on Parcopa 25/100 once a day which  apparently made him feel much better.  He is now admitted with chest  pain  and vertigo which developed when he was at the physical therapist.  He has  been on Klonopin,Wellbutrin, Zoloft, Valium, Vicodin, Zestril, and the  Parcopa 25/100 per day as an outpatient.  He has no family history.  He has  been seen by Dr. Milagros Evener for evaluation of depression with panic  attacks beginning in February 2006.  He was taken out of work in February  2006.  He has no history of alcoholism or chronic cigarette use.   PHYSICAL EXAMINATION:  GENERAL:  Well developed white male.  VITAL SIGNS:  Blood pressure right and left arm 110/60, standing 110/60,  heart rate 58 and regular.  There were no bruits.  NEUROLOGICAL:  Mental status exam reveals he is alert ands oriented x 3.  His cranial nerve examination reveals visual fields full, discs flat,  spontaneous venous pulsations were seen, the extraocular movements were full  and corneals  were present.  Facial sensation was equal.  Hearing was intact,  air conduction greater than bone conduction.  The tongue was midline, uvula  midline, gag was present.  Sternocleidomastoid and trapezius testing were  normal.  Motor examination revealed good strength in the upper and lower  extremities.  He had outstretched hand and arm tremor but good finger-to-  nose, poor heel to shin.  Sensory examination was intact.  Deep tendon  reflexes were 2+.  Plantar response was downgoing.  Gait revealed an  unsteady gait with a tendency to weave and fall backwards towards the bed.  He can stand on his toes, he can stand on his heels.  There was no evidence  of any ocular dysmetria, no nystagmus was present.   IMPRESSION:  1.  History of gait disorder, 781.2.  2.  History of vertigo, 788.4.  3.  Chest pain, code unknown.  4.  Depression, 311.  5.  Outstretched hand and arm tremor with poor heel-to-shin, 333.1, most      likely representing essential tremor.   PLAN:  Evaluate the patient with MRI studies with and without contrast   enhancement, sed rate, ANA, ASA titer, and anticardiolipin antibody.  At  this point, his examination has functional features in making it hard to  evaluate his overall situation.      JML/MEDQ  D:  06/26/2004  T:  06/26/2004  Job:  [id number]

## 2010-07-05 NOTE — Procedures (Signed)
[place]  Patient:    Scott Pineda                       MRN: [id number] Proc. Date: 12/26/98 Adm. Date:  [id number] Attending:  Jetty Duhamel Driver CC:         Gabriel Earing, M.D.                           Procedure Report  PROCEDURE PERFORMED:  Bronchoscopy.  OPERATOR:  Clinton D. Maple Hudson, M.D.  INDICATIONS FOR PROCEDURE:  A 50 year old nonsmoking white male with history of  cough for two months.  Chest x-ray and CT scan demonstrating nodular abnormalities and bilateral hilar adenopathy in the lungs, new since March of 1999. Preoperative exam showed oxygen saturation 94 to 96% on room air, blood pressure 118/78, pulse regular 70 per minute with normal heart sounds.  No adenopathy.  Crackles left chest, otherwise clear.  OUTPATIENT MEDICATIONS:  Zoloft, Prinivil, Claritin.  No medication allergies.  Clinical suspicion was sarcoidosis.  DESCRIPTION OF PROCEDURE:  After fully informed consent, bronchoscopy was performed on an outpatient basis in the endoscopy suite.  Premedication was with Demerol nd atropine. The upper airway was anesthetized with 2% Xylocaine and a cumulative ose of 5 mg of intravenous Versed was given for additional sedation and cough control. Oxygen was provided at 8 liters per minute by nasal prongs holding saturations ver 92% and cardiac rhythm was normal.  An Olympus fiberoptic bronchoscope was advanced via the right nostril to the level of the vocal cords without difficulty.  The cords moved normally.  The trachea, main carina and lobar and segmental airway branches to the fourth and fifth division were anatomically normal.  Secretions  were thin and clear.  There was diffuse erythematous bronchitis with a somewhat  granular appearance consistent with sarcoid.  Under fluoroscopic guidance, the bronchoscope was directed into the right middle and right upper lobes.  These areas were lavaged with  saline, brushed and then biopsied by standard transbronchial ung biopsy technique.  There was minimal self limited bleeding but no apparent complication.  Chest x-ray is pending.  He will be held until stable and released back to the family for outpatient follow-up.  FINAL IMPRESSION:  Clinical picture is consistent with sarcoid. DD:  12/26/98 TD:  12/27/98 Job: [id number]

## 2010-07-05 NOTE — Procedures (Signed)
EEG NUMBER:  [id number]   CLINICAL HISTORY:  The patient is a 50 year old who has had unsteady gait  and a Parkinson's like syndrome. The study is being done to look for the  presence of seizures.   PROCEDURE:  The tracing is carried out on a 32-channel digital Cadwell  recorder reformatted into 16 channel montages with one devoted to EKG. The  patient was awake during the recording and drowsy. He takes Sinemet,  Wellbutrin, Ambien, Skelaxin, Celebrex, Neurontin, Klonopin, and oxycodone.  The International 10-20 system of lead placement was used.   DESCRIPTION OF FINDINGS:  The dominant frequency is a 5-7 Hz 20 microvolt  activity that is broadly distributed. Superimposed upon this is mixed  frequency beta range activity superimposed upon muscle artifact in the  frontal regions.   Intermittent photic stimulation failed to induce a driving response.  Hyperventilation caused no significant change in background. EKG showed a  regular sinus rhythm with ventricular response of 96 beats per minute. With  drowsiness there was some increase in slowing into the lower theta/upper  delta range. However, light natural sleep was not achieved.   IMPRESSION:  Abnormal EEG on the basis of mild diffuse background slowing.  This is a nonspecific indicator of neuronal dysfunction and may be on a  primary degenerative basis or secondary to variety of toxic or metabolic  etiologies including medication effects.      Deanna Artis. Sharene Skeans, M.D.  Electronically Signed     ZOX:WRUE  D:  04/24/2005 18:12:27  T:  04/25/2005 14:19:16  Job #:  [id number]   cc:   Casimiro Needle L. Thad Ranger, M.D.  Fax: [phone]

## 2010-07-05 NOTE — Discharge Summary (Signed)
NAME:  Scott Pineda, Scott Pineda NO.:  [id number]   MEDICAL RECORD NO.:  [id number]          PATIENT TYPE:  INP   LOCATION:  3028                         FACILITY:  [facility]   PHYSICIAN:  Gordy Savers, M.D. LHCDATE OF BIRTH:  03-07-1960   DATE OF ADMISSION:  04/22/2005  DATE OF DISCHARGE:  04/29/2005                                 DISCHARGE SUMMARY   DISCHARGE DIAGNOSES:  1.  Change in mental status with episodes of severe agitation.  2.  Dementia.  3.  Anxiety/depression.  4.  Falls likely secondary to orthostatic hypotension.  5.  Parkinson's disease.   HISTORY OF PRESENT ILLNESS:  Patient is a 50 year old white male with  history of Parkinson's disease and gait instability.  He was most recently  admitted to [facility] on March 28, 2005 through March 31, 2005 following a minor traumatic brain injury after a fall from a ladder.  The patient was apparently stable after discharge until the day of admission  on April 22, 2005 when he fell down 12-15 stairs striking his head on a  concrete wall at the base of the stairs.  According to the patient's wife  patient was unconscious for 60 seconds following the fall.  He was  transported to emergency room where he underwent CT head which was negative  as well as radiographs for acute fracture dislocation which were negative.  He was noted in the emergency room to be quite agitated, combative requiring  parenteral Ativan as well as four-point restraints.  Patient was admitted  for further evaluation and treatment.   PAST MEDICAL HISTORY:  1.  Parkinson's disease.  Diagnosed 2006.  Followed by Dr. Avie Echevaria.  2.  Hospitalization last month for traumatic brain injury requiring      rehabilitation.  3.  Left carpal tunnel release.  4.  Inguinal hernia surgery.  5.  Midline abdominal surgery.  6.  L5-S1 fusion in 2000 and additional lumbar surgery in 2006.  7.  History of pulmonary sarcoid.  8.  History of  depression/panic disorder.  9.  Cataract surgery involving left eye.   HOSPITAL COURSE:  #1 - CHANGE IN MENTAL STATUS WITH EPISODES OF SEVERE  AGITATION:  The patient was admitted and underwent a CT of the head.  CT of  the head was negative.  The patient was given IV Ativan as needed for  agitation.  However, required large amounts of Ativan and, as well, patient  became combative.  Psychiatry consult was obtained on April 24, 2005 and it  was recommended that the patient start Depakote if cleared by neurology.  A  neurology consult was obtained and the patient was seen by Dr. Thad Ranger who  recommended an EEG and orthostatic vital signs.  EEG showed diffuse  background swelling which is likely secondary to patient's underlying  Parkinson's disease.  Patient became progressively more combative during  this hospitalization and psychiatry was reconsulted per Dr. Antonietta Breach  due to patient's impaired judgment, severe agitation requiring 10 people to  restrain him.  He is at risk for harming others and  for we feel self neglect  and an affidavit and petition for involuntary commitment was filled.  At  this time [facility] facility is willing to evaluate the patient for  further psychiatric treatment prior to discharge to home.   #2 - PARKINSON'S DISEASE:  Patient's Parkinson's disease remained stable  during this admission.  Plan to continue pre admission Sinemet dosing.   #3 - DEPRESSION/ANXIETY:  Patient will be maintained on p.r.n. Ativan as  well as once daily dosing of Wellbutrin and Depakote.   #4 - FREQUENT FALLS:  Etiology is unclear but it is thought that this may be  secondary to orthostatic hypotension.   PHYSICAL EXAMINATION:  VITAL SIGNS:  Blood pressure 120/77, heart rate 69,  respiratory rate 20, temperature 97.8, O2 saturation 94% on room air.  GENERAL:  Patient is asleep, but easily arousable.  CARDIOVASCULAR:  S1, S2.  Regular rate and rhythm.  LUNGS:  Clear to  auscultation bilaterally with no wheezes, rales, or  rhonchi.  ABDOMEN:  Soft, nontender, nondistended.  NEUROLOGIC:  Patient is calm at time of examination, moving all extremities.   DISCHARGE MEDICATIONS:  1.  Celebrex 200 mg p.o. daily.  2.  Neurontin 100 mg p.o. b.i.d.  3.  Sinemet CR 25/100 once daily.  4.  Klonopin 0.5 mg p.o. b.i.d.  5.  Zoloft 100 mg p.o. daily.  6.  Wellbutrin SR 150 mg p.o. daily.  7.  Ambien 5 mg p.o. at bedtime.  8.  Depakote ER 500 mg p.o. daily at dinner time.  9.  Ativan 1 mg p.o./IV every four hours as needed for agitation.   PERTINENT LABORATORIES AT DISCHARGE:  EEG:  Diffuse background slowing.  Hemoglobin 15, hematocrit 43.7, platelets 205, white blood cell count 7.7.  LFTs within normal limits.   DISPOSITION:  At this time arrangements are being made for transfer of  patient to [facility] for further psychiatric treatment.  Upon discharge  from [facility] patient will need follow-up with outpatient psych as well  as Dr. Berniece Andreas, his primary care physician.      Melissa S. Peggyann Juba, NP    ______________________________  Gordy Savers, M.D. [facility]    MSO/MEDQ  D:  04/29/2005  T:  04/29/2005  Job:  [id number]   cc:   Neta Mends. Fabian Sharp, M.D. [facility]  [address] [place]  [place] [address]

## 2010-07-05 NOTE — Discharge Summary (Signed)
NAME:  Scott Pineda, Scott Pineda NO.:  [id number]   MEDICAL RECORD NO.:  [id number]          PATIENT TYPE:  IPS   LOCATION:  4033                         FACILITY:  [facility]   PHYSICIAN:  Ranelle Oyster, M.D.DATE OF BIRTH:  05/21/1960   DATE OF ADMISSION:  03/28/2005  DATE OF DISCHARGE:  03/31/2005                                 DISCHARGE SUMMARY   DISCHARGE DIAGNOSES:  1.  Mild traumatic brain injury.  2.  Parkinsonism.  3.  Gastroenteritis, resolved.   HISTORY OF PRESENT ILLNESS:  Scott Pineda is a 50 year old male with a history  of Parkinsonism, who was on a ladder on February 5 when he sustained a fall  approximately 6 feet with positive confusion and amnesia events with no loss  of consciousness reported.  The patient with neck and back pain, and past  falls.  CT of head done was negative.  CT of C-spine noted also to be  negative.  The patient continues with complaints of low back pain and  spasms.  Still amnesia regarding events leading to the fall.  The patient is  noted to have problems with mobility and self-care, and rehab was consulted  for further therapy.   PAST MEDICAL HISTORY:  See discharge diagnoses, plus history of left carpal  tunnel release, excision __________, history of sarcoidosis, lumbar surgery  in 2006, positive depression and panic attacks, history of orthostasis,  chronic dizziness and essential tremors left upper extremity.   ALLERGIES:  NO KNOWN DRUG ALLERGIES.   FAMILY HISTORY:  Unknown, as the patient is adopted.   SOCIAL HISTORY:  The patient is married, self-employed, working part-time  doing Aeronautical engineer.  He does not use any tobacco or alcohol.  He lives in a 1-  level home with 5 steps at entry.   HOSPITAL COURSE:  Scott Pineda was admitted to rehab on March 28, 2005 for inpatient therapies to consist of PT and OT daily.  At time of  admission, the patient with continued complaints of dizziness.  He also had  continued  complaints of low back pain.  MRI of the L-spine was done showing  good appearance of fusion L5-S1, all other levels normal, discs unremarkable  and no posttraumatic change.  The patient was started on Celebrex, as well  as lidocaine patch to help with pain relief.  Skelaxin was used instead of  Flexeril to help with muscles and back spasms.  The patient's Vasotec was  placed on hold.  BPs have been monitored on a b.i.d. basis, overall have  been well controlled ranging from 120s to 140s systolics and 80s to 90s  diastolic.  Initially, orthostatic blood pressures were checked and the  patient was noted to have some tachycardia when going from supine to  standing.  Over the weekend past admission on February 10, the patient with  complaints of diarrhea and loose stool, question of viral gastroenteritis  that was going around the hospital.  Stool was checked for C. diff.  The  patient was started on Lomotil p.r.n.  By February 12 a.m., the patient's  diarrhea  had resolved.  He was tolerating eating breakfast without  difficulty.  Followup labs done revealed the patient with good hydration  status and no signs of hypokalemia.  CBC revealed no signs of leukocytosis  with white count at 8.8, H&H stable at 15.9 and 46.6.  Pain control was  managed with p.r.n. use of OxyIR.  The patient had decided to DC lidocaine  patches, as he felt did not require this anymore.  As patient with improved  symptomatology and supervision at home, and the patient felt that he could  manage at home with wife at current level.  He was discharged to home.  At  time of discharge, he was at modified independent level for transfers.  He  required supervision for ambulating at 300 feet without assistive device.  He is at supervision for toileting.  The patient has declined OT, bathing  and dressing assistance.  Further follow up outpatient PT and OT to be set  up at the [facility] beginning March 31, 2005.  The  patient is discharged to home.   DISCHARGE MEDICATIONS:  1.  Sinemet CR 25-100, 1 per day.  2.  Wellbutrin SR 150 mg b.i.d.  3.  Ambien 5 mg p.o. q.h.s. p.r.n.  4.  Skelaxin 800 mg t.i.d. for spasms.  5.  Celebrex 200 mg a day.  6.  Neurontin 100 mg b.i.d.  7.  Klonopin 0.5 mg t.i.d. p.r.n.  8.  OxyIR 5 to 10 mg q.4-6h. p.r.n. pain.   ACTIVITY:  Twenty-four-hour supervision.   DIET:  Regular.   SPECIAL INSTRUCTIONS:  No alcohol, no smoking and no driving.  No climbing  on ladders.  Do not use Flexeril.   FOLLOW UP:  The patient to follow up with Dr. Fabian Sharp for a routine check  including instructions regarding resuming driving, follow up with Dr. Sandria Manly  for a routine check and follow up with Dr. Riley Kill as needed.      Greg Cutter, P.A.      Ranelle Oyster, M.D.  Electronically Signed    PP/MEDQ  D:  03/31/2005  T:  04/01/2005  Job:  [id number]   cc:   Neta Mends. Fabian Sharp, M.D. [facility]  [address] [place]  [place] [address]   Genene Churn. Love, M.D.  Fax: [phone]   Trauma

## 2010-07-16 ENCOUNTER — Telehealth: Payer: Self-pay | Admitting: Internal Medicine

## 2010-07-16 MED ORDER — METFORMIN HCL ER 500 MG PO TB24: 1000.0000 mg | ORAL_TABLET | Freq: Every day | ORAL | Status: DC

## 2010-07-16 NOTE — Telephone Encounter (Signed)
Rx sent to pharmacy   

## 2010-07-16 NOTE — Telephone Encounter (Signed)
Pt needs metformin ?mg  call into [facility] [phone]. Pt will make ov in july

## 2010-07-24 ENCOUNTER — Telehealth: Payer: Self-pay | Admitting: *Deleted

## 2010-07-24 DIAGNOSIS — E785 Hyperlipidemia, unspecified: Secondary | ICD-10-CM

## 2010-07-24 DIAGNOSIS — E119 Type 2 diabetes mellitus without complications: Secondary | ICD-10-CM

## 2010-07-24 MED ORDER — LISINOPRIL 5 MG PO TABS: 5.0000 mg | ORAL_TABLET | Freq: Every day | ORAL | Status: DC

## 2010-07-24 MED ORDER — GLUCOSE BLOOD VI STRP: 1.0000 | ORAL_STRIP | Freq: Two times a day (BID) | Status: DC

## 2010-07-24 MED ORDER — SERTRALINE HCL 100 MG PO TABS: 100.0000 mg | ORAL_TABLET | Freq: Every day | ORAL | Status: DC

## 2010-07-24 NOTE — Telephone Encounter (Signed)
Spoke with wife and she will have him call back to make a follow up appt in July for fasting labs and a rov. Order placed in Epic.

## 2010-07-29 ENCOUNTER — Telehealth: Payer: Self-pay | Admitting: *Deleted

## 2010-07-29 MED ORDER — GLUCOSE BLOOD VI STRP: ORAL_STRIP | Status: DC

## 2010-07-29 NOTE — Telephone Encounter (Signed)
Pt wants true balance test strips. Rx sent to pharmacy

## 2010-09-05 NOTE — Telephone Encounter (Signed)
rx taken care

## 2010-09-06 ENCOUNTER — Encounter: Payer: Self-pay | Admitting: Internal Medicine

## 2010-09-06 DIAGNOSIS — T1490XA Injury, unspecified, initial encounter: Secondary | ICD-10-CM | POA: Insufficient documentation

## 2010-09-09 ENCOUNTER — Encounter: Payer: Self-pay | Admitting: Internal Medicine

## 2010-09-09 ENCOUNTER — Ambulatory Visit (INDEPENDENT_AMBULATORY_CARE_PROVIDER_SITE_OTHER): Payer: BC Managed Care – PPO | Admitting: Internal Medicine

## 2010-09-09 VITALS — BP 140/84 | HR 78 | Wt 224.0 lb

## 2010-09-09 DIAGNOSIS — F329 Major depressive disorder, single episode, unspecified: Secondary | ICD-10-CM

## 2010-09-09 DIAGNOSIS — E785 Hyperlipidemia, unspecified: Secondary | ICD-10-CM

## 2010-09-09 DIAGNOSIS — I10 Essential (primary) hypertension: Secondary | ICD-10-CM

## 2010-09-09 DIAGNOSIS — F3289 Other specified depressive episodes: Secondary | ICD-10-CM

## 2010-09-09 DIAGNOSIS — E119 Type 2 diabetes mellitus without complications: Secondary | ICD-10-CM

## 2010-09-09 LAB — CBC WITH DIFFERENTIAL/PLATELET
Basophils Absolute: 0 10*3/uL (ref 0.0–0.1)
Basophils Relative: 0.6 % (ref 0.0–3.0)
Eosinophils Absolute: 0.1 10*3/uL (ref 0.0–0.7)
Eosinophils Relative: 2.5 % (ref 0.0–5.0)
Hemoglobin: 15.1 g/dL (ref 13.0–17.0)
MCV: 91 fl (ref 78.0–100.0)
Monocytes Relative: 7.6 % (ref 3.0–12.0)
Neutro Abs: 2.8 10*3/uL (ref 1.4–7.7)
RDW: 13.1 % (ref 11.5–14.6)

## 2010-09-09 LAB — BASIC METABOLIC PANEL
Chloride: 101 mEq/L (ref 96–112)
Glucose, Bld: 164 mg/dL — ABNORMAL HIGH (ref 70–99)
Potassium: 4.5 mEq/L (ref 3.5–5.1)

## 2010-09-09 LAB — URIC ACID: Uric Acid, Serum: 7.1 mg/dL (ref 4.0–7.8)

## 2010-09-09 LAB — LIPID PANEL
Cholesterol: 226 mg/dL — ABNORMAL HIGH (ref 0–200)
HDL: 37.9 mg/dL — ABNORMAL LOW (ref >39.00)
Total CHOL/HDL Ratio: 6
Triglycerides: 449 mg/dL — ABNORMAL HIGH (ref 0.0–149.0)
VLDL: 89.8 mg/dL — ABNORMAL HIGH (ref 0.0–40.0)

## 2010-09-09 LAB — HEPATIC FUNCTION PANEL
ALT: 37 U/L (ref 0–53)
Albumin: 4.7 g/dL (ref 3.5–5.2)
Alkaline Phosphatase: 51 U/L (ref 39–117)
Total Bilirubin: 0.5 mg/dL (ref 0.3–1.2)
Total Protein: 8.1 g/dL (ref 6.0–8.3)

## 2010-09-09 LAB — HEMOGLOBIN A1C: Hgb A1c MFr Bld: 7.4 % — ABNORMAL HIGH (ref 4.6–6.5)

## 2010-09-09 LAB — TSH: TSH: 1.13 u[IU]/mL (ref 0.35–5.50)

## 2010-09-09 LAB — MICROALBUMIN / CREATININE URINE RATIO: Microalb, Ur: 0.4 mg/dL (ref 0.0–1.9)

## 2010-09-09 LAB — LDL CHOLESTEROL, DIRECT: Direct LDL: 118.8 mg/dL

## 2010-09-09 MED ORDER — LISINOPRIL 10 MG PO TABS: 10.0000 mg | ORAL_TABLET | Freq: Every day | ORAL | Status: DC

## 2010-09-09 NOTE — Patient Instructions (Signed)
Check your blood pressure ocassionally  Need BP readings  130/80 and below . Call if elevated  Otherwise   Plan Preventive  Visit  In 4-6 months or as needed.

## 2010-09-09 NOTE — Progress Notes (Signed)
  Subjective:    Patient ID: Scott Pineda, male    DOB: [birth year], 50 y.o.   MRN: [id number]  HPI Patient comes in today for followup visit of multiple medical problems. His last visit here was in December 2011. He has diabetes hypertension hyperlipidemia asthma GERD and depression.  At his last visit he had no medical coverage. 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 He brings his own food has his own refrigerator. No change in his medical health feels pretty well. Blood sugars seem to be controlled 1:30 the morning. Pressure he is taking 25 mg lisinopril. Lipids he has had myalgia before with statins so as not taking meds but is taking fish oil one a day.  Past history family history social history reviewed in the electronic medical record.   Review of Systems ROS:  GEN/ HEENTNo fever, significant weight changes sweats headaches vision problems hearing changes, CV/ PULM; No chest pain shortness of breath cough, syncope,edema  change in exercise tolerance. GI /GU: No adominal pain, vomiting, change in bowel habits. No blood in the stool. No significant GU symptoms. SKIN/HEME: ,no acute skin rashes suspicious lesions or bleeding. No lymphadenopathy, nodules, masses.  NEURO/ PSYCH:  No neurologic signs such as weakness numbness No depression anxiety. Mood is pretty good at this point on Zoloft IMM/ Allergy: No unusual infections.  Allergy .   REST of 12 system review negative   Past history family history social history reviewed in the electronic medical record.     Objective:   Physical Exam Physical Exam: Vital signs reviewed here with wife  VHQ:IONG is a well-developed well-nourished alert cooperative  White male  who appears   stated age in no acute distress.  HEENT: normocephalic  traumatic , Eyes: PERRL EOM's full, conjunctiva clear, Nares: patent no deformity discharge or tenderness., Ears: no  deformity EAC's clear TMs with normal landmarks. Mouth: clear OP, no lesions, edema.  Moist mucous membranes. Dentition some missing teeth NECK: supple without masses, thyromegaly or bruits. CHEST/PULM:  Clear to auscultation and percussion breath sounds equal no wheeze , rales or rhonchi. No chest wall deformities or tenderness. CV: PMI is nondisplaced, S1 S2 no gallops, murmurs, rubs. Peripheral pulses are full without delay.No JVD .  ABDOMEN: Bowel sounds normal nontender  No guard or rebound, no hepato splenomegal no CVA tenderness.  No hernia. Extremtities:  No clubbing cyanosis or edema, no acute joint swelling or redness no focal atrophy NEURO:  Oriented x3, cranial nerves 3-12 appear to be intact, no obvious focal weakness,gait within normal limits no abnormal reflexes or asymmetrical sensation intact left foot no ulcers or callus  SKIN: No acute rashes normal turgor, color, no bruising or petechiae. PSYCH: Oriented, good eye contact, no obvious depression anxiety, cognition and judgment appear normal. LN:  No cervical adenopathy      Assessment & Plan:  Hypertension  Slightly up today  change to 10 mg  Ace and will increase as needed  Diabetes Mellitus   Check a1c continue no se of meds Dyslipidemia  Statin intolerant prob check levels Obesity  Controlling by taking own food   Gout  No flares check level MOOD:   Doing well continue sertraline  Fu depending on labs

## 2010-09-16 ENCOUNTER — Telehealth: Payer: Self-pay | Admitting: *Deleted

## 2010-09-16 MED ORDER — METFORMIN HCL ER 500 MG PO TB24: 1000.0000 mg | ORAL_TABLET | Freq: Every day | ORAL | Status: DC

## 2010-09-16 NOTE — Telephone Encounter (Signed)
rx sent to pharmacy

## 2010-09-18 ENCOUNTER — Telehealth: Payer: Self-pay | Admitting: *Deleted

## 2010-09-18 DIAGNOSIS — E785 Hyperlipidemia, unspecified: Secondary | ICD-10-CM

## 2010-09-18 NOTE — Telephone Encounter (Signed)
Message copied by Romualdo Bolk on Wed Sep 18, 2010  3:21 PM ------      Message from: [facility] [place] [facility]      Created: Wed Sep 18, 2010  2:04 PM       Tell patient  Labs show still very high triglycerides although his bad cholesterol is a bit better.       Blood sugar introitus a little bit worse  Hg a1c 7.4             Thyroid and blood count liver tests are all normal. His kidney function is good.            Would have him intensify his healthy diet and activity.               I know we are avoiding statin medicine but may try lipofen 150 once a day  Samples and if tolerates then can do rx .  This should not cause muscle aches.            Recheck LIPIDS LFTS  In  2 months and then decide follow up.

## 2010-09-18 NOTE — Telephone Encounter (Signed)
Left message to call back  

## 2010-09-23 ENCOUNTER — Encounter: Payer: Self-pay | Admitting: Internal Medicine

## 2010-09-23 NOTE — Telephone Encounter (Signed)
Left to have either wife or pt call back.

## 2010-10-03 NOTE — Telephone Encounter (Signed)
Mailed letter for patient to call back.

## 2010-10-16 NOTE — Telephone Encounter (Signed)
Pt aware and will call back to schedule labs and rov. Samples given.

## 2010-12-06 ENCOUNTER — Other Ambulatory Visit: Payer: Self-pay | Admitting: Internal Medicine

## 2010-12-10 NOTE — Telephone Encounter (Signed)
Pls advise.  

## 2010-12-10 NOTE — Telephone Encounter (Signed)
Left a message for pt to return call 

## 2010-12-10 NOTE — Telephone Encounter (Signed)
He was due for lfts and lipid panel a month ago  Please schedule lipid lfts and hg a1c in a month Refill zoloft  For 3 months and have him get this lab work done

## 2010-12-11 ENCOUNTER — Other Ambulatory Visit: Payer: Self-pay

## 2010-12-11 DIAGNOSIS — E119 Type 2 diabetes mellitus without complications: Secondary | ICD-10-CM

## 2010-12-11 MED ORDER — METFORMIN HCL ER 500 MG PO TB24: 1000.0000 mg | ORAL_TABLET | Freq: Every day | ORAL | Status: DC

## 2010-12-11 MED ORDER — GLUCOSE BLOOD VI STRP: ORAL_STRIP | Status: DC

## 2010-12-11 NOTE — Telephone Encounter (Signed)
rx called in to pharmacy.  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

## 2011-06-19 ENCOUNTER — Other Ambulatory Visit: Payer: Self-pay

## 2011-06-19 DIAGNOSIS — E119 Type 2 diabetes mellitus without complications: Secondary | ICD-10-CM

## 2011-06-19 MED ORDER — METFORMIN HCL ER 500 MG PO TB24: 1000.0000 mg | ORAL_TABLET | Freq: Every day | ORAL | Status: DC

## 2011-06-19 NOTE — Telephone Encounter (Signed)
Rx sent to pharmacy for metformin 500 mg.

## 2011-07-15 ENCOUNTER — Other Ambulatory Visit: Payer: Self-pay | Admitting: Internal Medicine

## 2011-07-16 NOTE — Telephone Encounter (Signed)
Rx last filled 12/06/10 #30 x 3 rf.  Pt last seen 09/09/10.  Pls advise.

## 2011-07-17 NOTE — Telephone Encounter (Signed)
Is over due for check and lab Can refill  X 1 90 day refill  Needs ov and can do labs cpx and hg a1c  before or during visit.

## 2011-07-18 NOTE — Telephone Encounter (Signed)
Called and spoke with pt and pt states he will make an appt. Pt is going out of town the first week in June.  Rx sent to pharmacy.

## 2011-07-28 ENCOUNTER — Ambulatory Visit (INDEPENDENT_AMBULATORY_CARE_PROVIDER_SITE_OTHER): Payer: BC Managed Care – PPO | Admitting: Internal Medicine

## 2011-07-28 ENCOUNTER — Encounter: Payer: Self-pay | Admitting: Internal Medicine

## 2011-07-28 VITALS — BP 120/80 | HR 79 | Temp 98.2°F | Wt 227.0 lb

## 2011-07-28 DIAGNOSIS — F3289 Other specified depressive episodes: Secondary | ICD-10-CM

## 2011-07-28 DIAGNOSIS — F329 Major depressive disorder, single episode, unspecified: Secondary | ICD-10-CM

## 2011-07-28 DIAGNOSIS — E119 Type 2 diabetes mellitus without complications: Secondary | ICD-10-CM

## 2011-07-28 DIAGNOSIS — I10 Essential (primary) hypertension: Secondary | ICD-10-CM

## 2011-07-28 DIAGNOSIS — E785 Hyperlipidemia, unspecified: Secondary | ICD-10-CM

## 2011-07-28 LAB — LIPID PANEL
HDL: 33.2 mg/dL — ABNORMAL LOW (ref >39.00)
Triglycerides: 309 mg/dL — ABNORMAL HIGH (ref 0.0–149.0)
VLDL: 61.8 mg/dL — ABNORMAL HIGH (ref 0.0–40.0)

## 2011-07-28 LAB — CBC WITH DIFFERENTIAL/PLATELET
Basophils Relative: 0.5 % (ref 0.0–3.0)
Eosinophils Absolute: 0.1 10*3/uL (ref 0.0–0.7)
Eosinophils Relative: 1.7 % (ref 0.0–5.0)
Hemoglobin: 15.3 g/dL (ref 13.0–17.0)
Lymphocytes Relative: 43.5 % (ref 12.0–46.0)
MCHC: 33.4 g/dL (ref 30.0–36.0)
Neutro Abs: 2.7 10*3/uL (ref 1.4–7.7)
Neutrophils Relative %: 45.3 % (ref 43.0–77.0)
RBC: 5.01 Mil/uL (ref 4.22–5.81)
WBC: 6 10*3/uL (ref 4.5–10.5)

## 2011-07-28 LAB — BASIC METABOLIC PANEL
CO2: 27 mEq/L (ref 19–32)
Calcium: 9.2 mg/dL (ref 8.4–10.5)
Glucose, Bld: 142 mg/dL — ABNORMAL HIGH (ref 70–99)
Potassium: 4 mEq/L (ref 3.5–5.1)
Sodium: 138 mEq/L (ref 135–145)

## 2011-07-28 LAB — HEMOGLOBIN A1C: Hgb A1c MFr Bld: 7.4 % — ABNORMAL HIGH (ref 4.6–6.5)

## 2011-07-28 NOTE — Patient Instructions (Signed)
Get eye check.  Will notify you  of labs when available. Consider  whelchol or other med for lipids besides statin meds.

## 2011-07-28 NOTE — Progress Notes (Signed)
Subjective:    Patient ID: Scott Pineda, male    DOB: May 30, 1960, 51 y.o.   MRN: [id number]  HPI Patient comes in today for follow up of  multiple medical problems.  DM  Sugars better.   Dietary changes and taking the metformin. He states they're better since he's been taking vitamin D BP hasn't been checking but has been taking his medication didn't take it today he is here for labs. No chest pain shortness of breath feels well Gout no recurrence Depression on sertraline doing well according to his wife request to continue. Lipids  did not want to take a statin and 1 suggested we call in the fenofibrate he didn't want to take that is of concern for side effects Hasn't had an eye check in a couple years does wear glasses no change in dental status no unusual infections Review of Systems \\Negative  for chest pain shortness of breath syncope muscle aches change in vision numbness in feet oral sores. Past history family history social history reviewed in the electronic medical record. Outpatient Encounter Prescriptions as of 07/28/2011  Medication Sig Dispense Refill  . APPLE CIDER VINEGAR PO Take by mouth.        . cholecalciferol (VITAMIN D) 1000 UNITS tablet Take 1,000 Units by mouth 3 (three) times daily.      . fish oil-omega-3 fatty acids 1000 MG capsule Take 2 g by mouth daily.      Marland Kitchen glucose blood (TRUETEST TEST) test strip Use twice a day. Pt wants True Balance Test Strips  100 each  12  . lisinopril (PRINIVIL,ZESTRIL) 10 MG tablet Take 20 mg by mouth daily.        . metFORMIN (GLUCOPHAGE XR) 500 MG 24 hr tablet Take 2 tablets (1,000 mg total) by mouth daily with breakfast.  60 tablet  5  . Multiple Vitamin (MULTIVITAMIN) capsule Take 1 capsule by mouth daily.      . sertraline (ZOLOFT) 100 MG tablet TAKE 1 TABLET (100 MG TOTAL) BY MOUTH DAILY.  90 tablet  0  . DISCONTD: Fenofibrate (LIPOFEN) 150 MG CAPS Take 1 capsule by mouth daily.             Objective:   Physical Exam BP  160/100  Pulse 79  Temp(Src) 98.2 F (36.8 C) (Oral)  Wt 227 lb (102.967 kg)  SpO2 98% WDWN in na d repeat blood pressure normal right arm sitting HEENT normocephalic TMs clear OP clear some teeth missing but other teeth are in adequate repair nares patent eyes nonicteric Neck supple without masses thyromegaly or bruit chest CTA be a cecal cardiac S1-S2 no gallops or murmurs Abdomen soft without megaly guarding or rebound negative CCE Oriented x 3. Normal cognition, attention, speech. Not anxious or depressed appearing   Good eye contact .  Here with wife     Assessment & Plan:  DM  About 130 in am.    Better on vit d  About 170   get an eye check HT up today hx of low bp in past  Missed bp  meds today. Repeat better poss WC phenom  Mood   Doing well onsertraline. Asthma resp   Stable  Gout  Quiescent  Lipid: Continued lifestyle is very concerned does not want to be on a statin as he has had significant side effects in the past including muscle aches myopathy that were significant. Discuss with him we would not do the [place] with still consider other medicine classic needed  or appropriate.  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

## 2011-07-29 LAB — MICROALBUMIN / CREATININE URINE RATIO: Microalb Creat Ratio: 1.4 mg/g (ref 0.0–30.0)

## 2011-08-05 ENCOUNTER — Other Ambulatory Visit: Payer: Self-pay | Admitting: Family Medicine

## 2011-08-05 DIAGNOSIS — R799 Abnormal finding of blood chemistry, unspecified: Secondary | ICD-10-CM

## 2011-08-07 ENCOUNTER — Other Ambulatory Visit: Payer: Self-pay | Admitting: Internal Medicine

## 2011-12-16 ENCOUNTER — Other Ambulatory Visit: Payer: Self-pay | Admitting: Internal Medicine

## 2012-02-15 ENCOUNTER — Other Ambulatory Visit: Payer: Self-pay | Admitting: Internal Medicine

## 2012-04-09 ENCOUNTER — Telehealth: Payer: Self-pay | Admitting: Internal Medicine

## 2012-04-09 NOTE — Telephone Encounter (Signed)
Spoke to the pt's wife and informed her of all.  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  Will think about endocrinology and talk to [facility] when he comes in.

## 2012-04-09 NOTE — Telephone Encounter (Signed)
Patient Information:  Caller Name: Diane  Phone: [phone]  Patient: Scott Pineda  Gender: Male  DOB: 1960-05-22  Age: 52 Years  PCP: Berniece Andreas (Family Practice)  Office Follow Up:  Does the office need to follow up with this patient?: Yes  Instructions For The Office: Elevated blood sugar...unable to control  RN Note:  Advised to keep appt scheduled. Encouraged closely to mointor diet, increase water. Call back for questions, changes or concerns.  [employer] driver.  Symptoms  Reason For Call & Symptoms: Wife is calling about her husband. She states for the last month his blood sugar is getting harder to control staying in 200-300 range.  Being compliant with diet .  Metformin  1,000mg  total (2 at night).  GBS today # 298.  LOV 07/2011.  Patient has appt scheduled Monday 04/12/12 at 3:15.  Reviewed Health History In EMR: Yes  Reviewed Medications In EMR: Yes  Reviewed Allergies In EMR: Yes  Reviewed Surgeries / Procedures: No  Date of Onset of Symptoms: 03/09/2012  Treatments Tried: Taking Metformin, watching Diet.  Treatments Tried Worked: No  Guideline(s) Used:  Diabetes - High Blood Sugar  Disposition Per Guideline:   Home Care  Reason For Disposition Reached:   Blood glucose > 240 mg/dl (13 mmol/l)  Advice Given:  Treatment - Liquids  Drink at least one glass (8 oz or 240 ml) of water per hour for the next 4 hours. (Reason: adequate hydration will reduce hyperglycemia).  Generally, you should try to drink 6-8 glasses of water each day.  Treatment - Insulin  Continue to take your insulin, as prescribed by your doctor.  Measure and Record Your Blood Glucose  Every day you should measure your blood glucose before breakfast and before going to bed.  Record the results and show them to your doctor at your next office visit.  Daily Blood Glucose Goals  Pre-prandial (before meal): 70-130 mg/dL (0.9-8.1 mmol/l)  Post-prandial (2-3 hours after a meal): Less  than 180 mg/dL (10 mmol/l)  Expected Course  Your blood sugar continues to get above 240 mg/dl (13 mmol/l).  Call Back If:  Blood glucose more than 300 mg/dL (19.1 mmol/l), 2 or more times in a row.  Vomiting lasting more than 4 hours or unable to drink any liquids.  Rapid breathing occurs  You become worse.  RN Overrode Recommendation:  Follow Up With Office Later  Patient has appt for Monday 04/12/12

## 2012-04-09 NOTE — Telephone Encounter (Signed)
Offer and would encourage  to have him see endocrinology dr Alda Berthold the new endocrinologist  May get seen sooner and can give specialty advice   Also  I don't think he is on insulin based  Record   Please update the med list

## 2012-04-12 ENCOUNTER — Ambulatory Visit (INDEPENDENT_AMBULATORY_CARE_PROVIDER_SITE_OTHER): Payer: BC Managed Care – PPO | Admitting: Family

## 2012-04-12 ENCOUNTER — Encounter: Payer: Self-pay | Admitting: Family

## 2012-04-12 VITALS — BP 120/90 | HR 88 | Temp 98.1°F | Wt 221.0 lb

## 2012-04-12 DIAGNOSIS — E119 Type 2 diabetes mellitus without complications: Secondary | ICD-10-CM

## 2012-04-12 MED ORDER — GLIMEPIRIDE 2 MG PO TABS: 2.0000 mg | ORAL_TABLET | Freq: Every day | ORAL | Status: DC

## 2012-04-12 MED ORDER — GLUCOSE BLOOD VI STRP: ORAL_STRIP | Status: DC

## 2012-04-12 MED ORDER — SERTRALINE HCL 100 MG PO TABS: 100.0000 mg | ORAL_TABLET | Freq: Every day | ORAL | Status: DC

## 2012-04-12 NOTE — Progress Notes (Signed)
Subjective:    Patient ID: Scott Pineda, male    DOB: 26-Jul-1960, 52 y.o.   MRN: [id number]  HPI 52 year old white male, nonsmoker, patient of Dr. Fabian Sharp is in today with complaints of elevated blood sugars that have been ongoing for about 6 months but worsening. Reports having blood sugars between 168-480. He does not routinely exercise. 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 Does notice an increase in urination and thirst but denies any blurred vision. He has not seen Dr. Fabian Sharp since June 2013.   Review of Systems  Constitutional: Negative.   HENT: Negative.   Respiratory: Negative.   Cardiovascular: Negative.   Gastrointestinal: Negative.   Genitourinary: Negative.   Musculoskeletal: Negative.   Allergic/Immunologic: Negative.   Neurological: Negative.   Hematological: Negative.   Psychiatric/Behavioral: Negative.    Past Medical History  Diagnosis Date  . Allergic rhinitis   . Asthma   . Depression   . GERD (gastroesophageal reflux disease)   . Gout   . Hyperlipidemia   . Hypertension   . Trauma     hx of falling trauma and back pain with side effects of medication, parkisonian now resolved  . Salivary stone     History   Social History  . Marital Status: Married    Spouse Name: N/A    Number of Children: N/A  . Years of Education: N/A   Occupational History  . Not on file.   Social History Main Topics  . Smoking status: Never Smoker   . Smokeless tobacco: Not on file  . Alcohol Use: No  . Drug Use: No  . Sexually Active:    Other Topics Concern  . 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 Salivary stone extracted     . Appendectomy    . Cholecystectomy    . Tonsillectomy    . Hernia repair    . Cataract extraction      left 2009 and right 2006    Family History  Problem Relation Age of Onset  . Adopted: Yes    Allergies  Allergen Reactions  . Ibuprofen     REACTION: unspecified  . Statins     Myalgias weakness of significance     Current Outpatient Prescriptions on File Prior to Visit  Medication Sig Dispense Refill  . APPLE CIDER VINEGAR PO Take by mouth.        . cholecalciferol (VITAMIN D) 1000 UNITS tablet Take 1,000 Units by mouth 3 (three) times daily.      . fish oil-omega-3 fatty acids 1000 MG capsule Take 2 g by mouth daily.      Marland Kitchen lisinopril (PRINIVIL,ZESTRIL) 10 MG tablet TAKE 1 TABLET (10 MG TOTAL) BY MOUTH DAILY.  90 tablet  2  . metFORMIN (GLUCOPHAGE-XR) 500 MG 24 hr tablet TAKE 2 TABLETS (1,000 MG TOTAL) BY MOUTH DAILY WITH BREAKFAST.  60 tablet  5  . Multiple Vitamin (MULTIVITAMIN) capsule Take 1 capsule by mouth daily.      Marland Kitchen  lisinopril (PRINIVIL,ZESTRIL) 10 MG tablet Take 20 mg by mouth daily.         No current facility-administered medications on file prior to visit.    BP 120/90  Pulse 88  Temp(Src) 98.1 F (36.7 C) (Oral)  Wt 221 lb (100.245 kg)  BMI 31.71 kg/m2  SpO2 97%chart    Objective:   Physical Exam  Constitutional: He is oriented to person, place, and time. He appears well-developed and well-nourished.  HENT:  Right Ear: External ear normal.  Left Ear: External ear normal.  Nose: Nose normal.  Mouth/Throat: Oropharynx is clear and moist.  Neck: Normal range of motion. Neck supple.  Cardiovascular: Normal rate, regular rhythm and normal heart sounds.   Pulmonary/Chest: Effort normal and breath sounds normal.  Abdominal: Soft. Bowel sounds are normal.  Musculoskeletal: Normal range of motion.  Neurological: He is alert and oriented to person, place, and time.  Skin: Skin is warm and dry.  Psychiatric: He has a normal mood and affect.           Assessment & Plan:  Assessment:  1. Type 2 diabetes-uncontrolled 2. Depression 3. Hypertension  Plan: Add Amaryl 2 mg once daily. Continue metformin. 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 Encouraged healthy diet and exercise. Patient will check blood sugars once fasting and once postprandially daily. Followup with Dr. Fabian Sharp in one month with blood sugar log and sooner as needed.

## 2012-04-12 NOTE — Patient Instructions (Signed)

## 2012-04-13 LAB — HEPATIC FUNCTION PANEL
ALT: 51 U/L (ref 0–53)
AST: 49 U/L — ABNORMAL HIGH (ref 0–37)
Albumin: 4.6 g/dL (ref 3.5–5.2)
Alkaline Phosphatase: 51 U/L (ref 39–117)
Bilirubin, Direct: 0.1 mg/dL (ref 0.0–0.3)
Total Protein: 8.1 g/dL (ref 6.0–8.3)

## 2012-04-13 LAB — BASIC METABOLIC PANEL
CO2: 24 mEq/L (ref 19–32)
Calcium: 9.7 mg/dL (ref 8.4–10.5)
Chloride: 101 mEq/L (ref 96–112)
Glucose, Bld: 169 mg/dL — ABNORMAL HIGH (ref 70–99)
Potassium: 4.3 mEq/L (ref 3.5–5.1)
Sodium: 136 mEq/L (ref 135–145)

## 2012-04-13 LAB — HEMOGLOBIN A1C: Hgb A1c MFr Bld: 9.9 % — ABNORMAL HIGH (ref 4.6–6.5)

## 2012-05-17 ENCOUNTER — Ambulatory Visit (INDEPENDENT_AMBULATORY_CARE_PROVIDER_SITE_OTHER): Payer: BC Managed Care – PPO | Admitting: Internal Medicine

## 2012-05-17 ENCOUNTER — Encounter: Payer: Self-pay | Admitting: Internal Medicine

## 2012-05-17 ENCOUNTER — Ambulatory Visit: Payer: BC Managed Care – PPO | Admitting: Internal Medicine

## 2012-05-17 VITALS — BP 136/96 | HR 87 | Temp 98.1°F | Wt 226.0 lb

## 2012-05-17 DIAGNOSIS — I10 Essential (primary) hypertension: Secondary | ICD-10-CM

## 2012-05-17 DIAGNOSIS — E1165 Type 2 diabetes mellitus with hyperglycemia: Secondary | ICD-10-CM

## 2012-05-17 DIAGNOSIS — E119 Type 2 diabetes mellitus without complications: Secondary | ICD-10-CM

## 2012-05-17 NOTE — Progress Notes (Signed)
Chief Complaint  Patient presents with  . Follow-up  . Diabetes  . Hypertension    HPI: Patient comes in today for follow up of  multiple medical problems. Here with wife today He was seen last month with diabetes out of control not having been seen for over 6 months. At that time he did have a respiratory illness. He saw our colleague and was placed on Amaryl 2 mg a day in addition to his metformin. Since that time he has been monitoring his blood sugars more carefully and in the last week they are down in the 110-120 range. He denies any low blood sugars. He still watching what he eats 89    120 in am .  No lows  Recently. Still taking the metformin.  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 At a time before he comes home Checking  Sugars  First in am and pre dinner.   Forgot machine today that records his readings. His wife confirms these readings as he takes a picture and send it to her. ROS: See pertinent positives and negatives per HPI. No chest pain shortness of breath he was out of his blood pressure medicine for a number of days because the pharmacy filled the wrong prescription but now has it took one just before he came.  Mood is stable no unusual infections or numbness.  Past Medical History  Diagnosis Date  . Allergic rhinitis   . Asthma   . Depression   . GERD (gastroesophageal reflux disease)   . Gout   . Hyperlipidemia   . Hypertension   . Trauma     hx of falling trauma and back pain with side effects of medication, parkisonian now resolved  . Salivary stone   . ADVERSE REACTION TO MEDICATION 10/09/2008    Qualifier: Diagnosis of  By: Fabian Sharp MD, Neta Mends     Family History  Problem Relation Age of Onset  . Adopted: Yes    History   Social History  . Marital Status: Married    Spouse Name: N/A    Number of Children: N/A   . Years of Education: N/A   Social History Main Topics  . Smoking status: Never Smoker   . Smokeless tobacco: None  . Alcohol Use: No  . Drug Use: No  . Sexually Active:    Other Topics Concern  . None   Social History Publishing copy  Days week at a time and home for weekends [employer]    Married    Regular exercise- no   Son   To leave [employer] in a week and be at home   [facility] of 4   9 dogs dog had puppies   8-10 hours per night sleep    Outpatient Encounter Prescriptions as of 05/17/2012  Medication Sig Dispense Refill  . APPLE CIDER VINEGAR PO Take by mouth.        . cholecalciferol (VITAMIN D) 1000 UNITS tablet Take 1,000 Units by mouth 3 (three) times daily.      Marland Kitchen glimepiride (AMARYL) 2 MG tablet Take 1 tablet (2 mg total) by mouth daily before breakfast.  30 tablet  3  . glucose blood (TRUETEST TEST) test strip Use twice a day. Pt wants True Balance Test Strips  100 each  12  . lisinopril (PRINIVIL,ZESTRIL) 10 MG tablet TAKE 1 TABLET (10 MG TOTAL) BY MOUTH DAILY.  90 tablet  2  . metFORMIN (GLUCOPHAGE-XR) 500 MG 24 hr tablet TAKE 2 TABLETS (1,000 MG TOTAL) BY MOUTH DAILY WITH BREAKFAST.  60 tablet  5  . Multiple Vitamin (MULTIVITAMIN) capsule Take 1 capsule by mouth daily.      . sertraline (ZOLOFT) 100 MG tablet Take 1 tablet (100 mg total) by mouth daily.  90 tablet  1  . fish oil-omega-3 fatty acids 1000 MG capsule Take 2 g by mouth daily.      . [DISCONTINUED] lisinopril (PRINIVIL,ZESTRIL) 10 MG tablet Take 20 mg by mouth daily.         No facility-administered encounter medications on file as of 05/17/2012.    EXAM:  BP 136/96  Pulse 87  Temp(Src) 98.1 F (36.7 C) (Oral)  Wt 226 lb (102.513 kg)  BMI 32.43 kg/m2  SpO2 97%  Body mass index is 32.43 kg/(m^2).  GENERAL: vitals reviewed and listed above, alert, oriented, appears well hydrated and in no acute distress looks well today here with his wife  HEENT: atraumatic, conjunctiva  clear,  no obvious abnormalities on inspection of external nose and ears OP : no lesion edema or exudate wearing glasses  NECK: no obvious masses on inspection palpation no adenopathy or bruit noted LUNGS: clear to auscultation bilaterally, no wheezes, rales or rhonchi, good air movement  CV: HRRR, no clubbing cyanosis or  peripheral edema nl cap refill  Abdomen soft without organomegaly MS: moves all extremities without noticeable focal  abnormality  PSYCH: pleasant and cooperative, no obvious depression or anxiety Lab Results  Component Value Date   WBC 6.0 07/28/2011   HGB 15.3 07/28/2011   HCT 45.6 07/28/2011   PLT 201.0 07/28/2011   GLUCOSE 169* 04/12/2012   CHOL 219* 07/28/2011   TRIG 309.0* 07/28/2011   HDL 33.20* 07/28/2011   LDLDIRECT 135.3 07/28/2011   ALT 51 04/12/2012   AST 49* 04/12/2012   NA 136 04/12/2012   K 4.3 04/12/2012   CL 101 04/12/2012   CREATININE 0.9 04/12/2012   BUN 15 04/12/2012   CO2 24 04/12/2012   TSH 0.81 07/28/2011   HGBA1C 9.9* 04/12/2012   MICROALBUR 1.1 07/28/2011    ASSESSMENT AND PLAN:  Discussed the following assessment and plan:  Diabetes - Plan: POC Glucose (CBG)  Type 2 diabetes mellitus, uncontrolled - Improved readings coming under control. Seems to respond well to the sulfonylurea  HYPERTENSION - Out of medicine for a few days Discussed risk-benefit counseled close followup get on the schedule even if he has to change it at the last minute much easier to reschedule we'll try to accommodate his schedule. Plan an A1c in about 2 months and an office visit with me  Blood pressures borderline has been out of the medicine a few days monitor this to make sure it's controlled. -Patient advised to return or notify health care team  if symptoms worsen or persist or new concerns arise.  Patient Instructions  Continue same medications as discussed    Continue monitor your blood sugar   As you are doing.   Hg a1c in 2 months  And then Ov with me      Neta Mends. Panosh M.D.

## 2012-05-17 NOTE — Patient Instructions (Signed)
Continue same medications as discussed    Continue monitor your blood sugar   As you are doing.   Hg a1c in 2 months  And then Ov with me

## 2012-06-17 ENCOUNTER — Other Ambulatory Visit: Payer: Self-pay | Admitting: Internal Medicine

## 2012-06-21 ENCOUNTER — Other Ambulatory Visit: Payer: Self-pay | Admitting: Family Medicine

## 2012-06-21 MED ORDER — METFORMIN HCL ER 500 MG PO TB24: ORAL_TABLET | ORAL | Status: DC

## 2012-07-15 ENCOUNTER — Other Ambulatory Visit: Payer: Self-pay | Admitting: Internal Medicine

## 2012-07-19 ENCOUNTER — Other Ambulatory Visit: Payer: BC Managed Care – PPO

## 2012-08-02 ENCOUNTER — Ambulatory Visit: Payer: BC Managed Care – PPO | Admitting: Internal Medicine

## 2012-08-06 ENCOUNTER — Other Ambulatory Visit: Payer: Self-pay | Admitting: Family

## 2012-08-13 ENCOUNTER — Other Ambulatory Visit: Payer: BC Managed Care – PPO

## 2012-08-16 ENCOUNTER — Other Ambulatory Visit (INDEPENDENT_AMBULATORY_CARE_PROVIDER_SITE_OTHER): Payer: BC Managed Care – PPO

## 2012-08-16 DIAGNOSIS — E1110 Type 2 diabetes mellitus with ketoacidosis without coma: Secondary | ICD-10-CM

## 2012-08-16 DIAGNOSIS — E1310 Other specified diabetes mellitus with ketoacidosis without coma: Secondary | ICD-10-CM

## 2012-08-16 LAB — HEMOGLOBIN A1C: Hgb A1c MFr Bld: 8.2 % — ABNORMAL HIGH (ref 4.6–6.5)

## 2012-09-13 ENCOUNTER — Ambulatory Visit: Payer: BC Managed Care – PPO | Admitting: Internal Medicine

## 2012-09-13 ENCOUNTER — Other Ambulatory Visit: Payer: Self-pay | Admitting: Internal Medicine

## 2012-09-13 ENCOUNTER — Other Ambulatory Visit: Payer: Self-pay | Admitting: Family

## 2012-11-18 ENCOUNTER — Other Ambulatory Visit: Payer: Self-pay | Admitting: Internal Medicine

## 2012-12-06 ENCOUNTER — Encounter: Payer: BC Managed Care – PPO | Admitting: Internal Medicine

## 2012-12-06 ENCOUNTER — Encounter: Payer: Self-pay | Admitting: Internal Medicine

## 2012-12-06 NOTE — Progress Notes (Signed)
Document opened and reviewed for OV but appt  canceled same day .  

## 2012-12-10 ENCOUNTER — Encounter: Payer: Self-pay | Admitting: Internal Medicine

## 2012-12-10 ENCOUNTER — Ambulatory Visit (INDEPENDENT_AMBULATORY_CARE_PROVIDER_SITE_OTHER): Payer: BC Managed Care – PPO | Admitting: Internal Medicine

## 2012-12-10 VITALS — BP 118/84 | HR 82 | Temp 98.0°F | Wt 227.0 lb

## 2012-12-10 DIAGNOSIS — I10 Essential (primary) hypertension: Secondary | ICD-10-CM

## 2012-12-10 DIAGNOSIS — E785 Hyperlipidemia, unspecified: Secondary | ICD-10-CM

## 2012-12-10 DIAGNOSIS — E119 Type 2 diabetes mellitus without complications: Secondary | ICD-10-CM

## 2012-12-10 DIAGNOSIS — Z23 Encounter for immunization: Secondary | ICD-10-CM

## 2012-12-10 LAB — LIPID PANEL
HDL: 29.8 mg/dL — ABNORMAL LOW (ref >39.00)
Total CHOL/HDL Ratio: 7
VLDL: 72.2 mg/dL — ABNORMAL HIGH (ref 0.0–40.0)

## 2012-12-10 LAB — BASIC METABOLIC PANEL
BUN: 12 mg/dL (ref 6–23)
CO2: 28 mEq/L (ref 19–32)
Calcium: 9.6 mg/dL (ref 8.4–10.5)
Chloride: 100 mEq/L (ref 96–112)
Creatinine, Ser: 1 mg/dL (ref 0.4–1.5)
Glucose, Bld: 145 mg/dL — ABNORMAL HIGH (ref 70–99)
Potassium: 4.3 mEq/L (ref 3.5–5.1)

## 2012-12-10 LAB — HEPATIC FUNCTION PANEL
AST: 25 U/L (ref 0–37)
Bilirubin, Direct: 0 mg/dL (ref 0.0–0.3)
Total Bilirubin: 0.7 mg/dL (ref 0.3–1.2)

## 2012-12-10 LAB — LDL CHOLESTEROL, DIRECT: Direct LDL: 133.3 mg/dL

## 2012-12-10 LAB — HEMOGLOBIN A1C: Hgb A1c MFr Bld: 6.9 % — ABNORMAL HIGH (ref 4.6–6.5)

## 2012-12-10 LAB — GLUCOSE, POCT (MANUAL RESULT ENTRY): POC Glucose: 133 mg/dl — AB (ref 70–99)

## 2012-12-10 NOTE — Progress Notes (Signed)
Chief Complaint  Patient presents with  . Follow-up  . Diabetes    HPI: Patient comes in today for follow up of  multiple medical problems.  Delayed appt 10 20  Has new  Grandchildren   4 weeks .  Changed  eating habits   No potatos no macdonals  Since June   Makes and packs own food for trips  2 weeks worth.  BG much better  ? If feeling  Lows  Seldom  Was 3 weeks  When  Meal.  ago.   BP takin med doing well No neuro sx visin changes  "fasting today"  ROS: See pertinent positives and negatives per HPI.no cp sob numbness or weakness  zoloft helping to continue.   Past Medical History  Diagnosis Date  . Allergic rhinitis   . Asthma   . Depression   . GERD (gastroesophageal reflux disease)   . Gout   . Hyperlipidemia   . Hypertension   . Trauma     hx of falling trauma and back pain with side effects of medication, parkisonian now resolved  . Salivary stone   . ADVERSE REACTION TO MEDICATION 10/09/2008    Qualifier: Diagnosis of  By: Fabian Sharp MD, Neta Mends     Family History  Problem Relation Age of Onset  . Adopted: Yes    History   Social History  . Marital Status: Married    Spouse Name: N/A    Number of Children: N/A  . Years of Education: N/A   Social History Main Topics  . Smoking status: Never Smoker   . Smokeless tobacco: None  . Alcohol Use: No  . Drug Use: No  . Sexual Activity:    Other Topics Concern  . None   Social History Publishing copy  Days week at a time and home for weekends [employer]    Married    Regular exercise- no   Son   To leave [employer] in a week and be at home   [facility] of 4   9 dogs dog had puppies   8-10 hours per night sleep    Outpatient Encounter Prescriptions as of 12/10/2012  Medication Sig Dispense Refill  . APPLE CIDER VINEGAR PO Take by mouth.        . cholecalciferol (VITAMIN D) 1000 UNITS tablet Take 1,000 Units by mouth 3 (three) times daily.      . fish oil-omega-3 fatty acids 1000 MG capsule Take 2  g by mouth daily.      Marland Kitchen glimepiride (AMARYL) 2 MG tablet TAKE 1 TABLET (2 MG TOTAL) BY MOUTH DAILY BEFORE BREAKFAST.  30 tablet  5  . glucose blood (TRUETEST TEST) test strip Use twice a day. Pt wants True Balance Test Strips  100 each  12  . lisinopril (PRINIVIL,ZESTRIL) 10 MG tablet TAKE 1 TABLET (10 MG TOTAL) BY MOUTH DAILY.  90 tablet  2  . lisinopril (PRINIVIL,ZESTRIL) 10 MG tablet TAKE 1 TABLET BY MOUTH DAILY  90 tablet  0  . metFORMIN (GLUCOPHAGE-XR) 500 MG 24 hr tablet TAKE 2 TABLETS (1,000 MG TOTAL) BY MOUTH DAILY WITH BREAKFAST.  60 tablet  0  . Multiple Vitamin (MULTIVITAMIN) capsule Take 1 capsule by mouth daily.      . sertraline (ZOLOFT) 100 MG tablet TAKE 1 TABLET (100 MG TOTAL) BY MOUTH DAILY.  90 tablet  0   No facility-administered encounter medications on file as of 12/10/2012.    EXAM:  BP 118/84  Pulse 82  Temp(Src) 98 F (36.7 C) (Oral)  Wt 227 lb (102.967 kg)  BMI 32.57 kg/m2  SpO2 97%  Body mass index is 32.57 kg/(m^2). Wt Readings from Last 3 Encounters:  12/10/12 227 lb (102.967 kg)  05/17/12 226 lb (102.513 kg)  04/12/12 221 lb (100.245 kg)    GENERAL: vitals reviewed and listed above, alert, oriented, appears well hydrated and in no acute distress Here with wife looks well  HEENT: atraumatic, conjunctiva  clear, no obvious abnormalities on inspection of external nose and ears  NECK: no obvious masses on inspection palpation  No jvd LUNGS: clear to auscultation bilaterally, no wheezes, rales or rhonchi, good air movement CV: HRRR, no clubbing cyanosis or  peripheral edema nl cap refill  MS: moves all extremities without noticeable focal  abnormality PSYCH: pleasant and cooperative, no obvious depression or anxiety Lab Results  Component Value Date   WBC 6.0 07/28/2011   HGB 15.3 07/28/2011   HCT 45.6 07/28/2011   PLT 201.0 07/28/2011   GLUCOSE 145* 12/10/2012   CHOL 217* 12/10/2012   TRIG 361.0* 12/10/2012   HDL 29.80* 12/10/2012   LDLDIRECT  133.3 12/10/2012   ALT 33 12/10/2012   AST 25 12/10/2012   NA 137 12/10/2012   K 4.3 12/10/2012   CL 100 12/10/2012   CREATININE 1.0 12/10/2012   BUN 12 12/10/2012   CO2 28 12/10/2012   TSH 0.81 07/28/2011   HGBA1C 6.9* 12/10/2012   MICROALBUR 1.1 07/28/2011    ASSESSMENT AND PLAN:  Discussed the following assessment and plan:  DIABETES-TYPE 2 - prob better control  - Plan: Basic metabolic panel, Hemoglobin A1c, Lipid panel, Hepatic function panel, POC Glucose (CBG)  HYPERLIPIDEMIA - statin intolerant - Plan: Basic metabolic panel, Hemoglobin A1c, Lipid panel, Hepatic function panel  HYPERTENSION - Plan: Basic metabolic panel, Hemoglobin A1c, Lipid panel, Hepatic function panel  Need for prophylactic vaccination and inoculation against influenza - Plan: Flu Vaccine QUAD 36+ mos PF IM (Fluarix) Flu vaccine  Agreed to  After discussion.. inproved sugar control at present .  Follow  Statin intolerant   . -Patient advised to return or notify health care team  if symptoms worsen or persist or new concerns arise.  Patient Instructions  Continue lifestyle intervention healthy eating and exercise . Will notify you  of labs when available. Then plan follow up.  Usually .  4-6 months or so.    Neta Mends. Guilherme Schwenke M.D.

## 2012-12-10 NOTE — Patient Instructions (Addendum)
Continue lifestyle intervention healthy eating and exercise . Will notify you  of labs when available. Then plan follow up.  Usually .  4-6 months or so.

## 2012-12-14 ENCOUNTER — Other Ambulatory Visit: Payer: Self-pay | Admitting: Internal Medicine

## 2012-12-16 ENCOUNTER — Other Ambulatory Visit: Payer: Self-pay | Admitting: Family Medicine

## 2012-12-16 DIAGNOSIS — E119 Type 2 diabetes mellitus without complications: Secondary | ICD-10-CM

## 2012-12-25 ENCOUNTER — Other Ambulatory Visit: Payer: Self-pay | Admitting: Internal Medicine

## 2012-12-30 ENCOUNTER — Encounter: Payer: Self-pay | Admitting: Internal Medicine

## 2012-12-30 ENCOUNTER — Ambulatory Visit (INDEPENDENT_AMBULATORY_CARE_PROVIDER_SITE_OTHER): Payer: BC Managed Care – PPO | Admitting: Internal Medicine

## 2012-12-30 VITALS — BP 140/90 | HR 88 | Temp 98.4°F | Wt 225.0 lb

## 2012-12-30 DIAGNOSIS — B029 Zoster without complications: Secondary | ICD-10-CM

## 2012-12-30 DIAGNOSIS — E119 Type 2 diabetes mellitus without complications: Secondary | ICD-10-CM

## 2012-12-30 MED ORDER — GABAPENTIN 100 MG PO CAPS: 100.0000 mg | ORAL_CAPSULE | Freq: Three times a day (TID) | ORAL | Status: DC

## 2012-12-30 MED ORDER — VALACYCLOVIR HCL 1 G PO TABS: 1000.0000 mg | ORAL_TABLET | Freq: Three times a day (TID) | ORAL | Status: DC

## 2012-12-30 NOTE — Progress Notes (Signed)
Chief Complaint  Patient presents with  . Rash    HPI:  Patient comes in today for SDA for  new problem evaluation. With wife   Onset sore ness days and then rash 2 days.  Right  Chest hurts badly and want able to continue to work . Wife had to come get him on the road . No fever trauma  . No hx of same  Has side effects with pain meds   ( cod and hydroc in past?)   Sugars ocass low after eating in the 110 range    ROS: See pertinent positives and negatives per HPI.  Past Medical History  Diagnosis Date  . Allergic rhinitis   . Asthma   . Depression   . GERD (gastroesophageal reflux disease)   . Gout   . Hyperlipidemia   . Hypertension   . Trauma     hx of falling trauma and back pain with side effects of medication, parkisonian now resolved  . Salivary stone   . ADVERSE REACTION TO MEDICATION 10/09/2008    Qualifier: Diagnosis of  By: Fabian Sharp MD, Neta Mends     Family History  Problem Relation Age of Onset  . Adopted: Yes    History   Social History  . Marital Status: Married    Spouse Name: N/A    Number of Children: N/A  . Years of Education: N/A   Social History Main Topics  . Smoking status: Never Smoker   . Smokeless tobacco: None  . Alcohol Use: No  . Drug Use: No  . Sexual Activity:    Other Topics Concern  . None   Social History Publishing copy  Days week at a time and home for weekends [employer]    Married    Regular exercise- no   Son   To leave [employer] in a week and be at home   [facility] of 4   9 dogs dog had puppies   8-10 hours per night sleep   New grand child GD     Outpatient Encounter Prescriptions as of 12/30/2012  Medication Sig  . APPLE CIDER VINEGAR PO Take by mouth.    . cholecalciferol (VITAMIN D) 1000 UNITS tablet Take 1,000 Units by mouth 3 (three) times daily.  . fish oil-omega-3 fatty acids 1000 MG capsule Take 2 g by mouth daily.  Marland Kitchen glimepiride (AMARYL) 2 MG tablet TAKE 1 TABLET (2 MG TOTAL) BY MOUTH DAILY  BEFORE BREAKFAST.  Marland Kitchen glucose blood (TRUETEST TEST) test strip Use twice a day. Pt wants True Balance Test Strips  . lisinopril (PRINIVIL,ZESTRIL) 10 MG tablet TAKE 1 TABLET (10 MG TOTAL) BY MOUTH DAILY.  Marland Kitchen lisinopril (PRINIVIL,ZESTRIL) 10 MG tablet TAKE 1 TABLET BY MOUTH DAILY  . metFORMIN (GLUCOPHAGE-XR) 500 MG 24 hr tablet Take 2 tablets (1,000 mg total) by mouth daily with breakfast.  . Multiple Vitamin (MULTIVITAMIN) capsule Take 1 capsule by mouth daily.  . sertraline (ZOLOFT) 100 MG tablet TAKE 1 TABLET (100 MG TOTAL) BY MOUTH DAILY.  Marland Kitchen gabapentin (NEURONTIN) 100 MG capsule Take 1 capsule (100 mg total) by mouth 3 (three) times daily. FOR PAIN AND CAN INCREASE TO 300 MG PER DOSE AS DIRECTED  . valACYclovir (VALTREX) 1000 MG tablet Take 1 tablet (1,000 mg total) by mouth 3 (three) times daily. FOR SHINGLES    EXAM:  BP 140/90  Pulse 88  Temp(Src) 98.4 F (36.9 C) (Oral)  Wt 225 lb (102.059 kg)  SpO2 98%  Body mass index is 32.28 kg/(m^2).  GENERAL: vitals reviewed and listed above, alert, oriented, appears well hydrated and in no acute distress mod uncomfortable no toxic  HEENT: atraumatic, conjunctiva  clear, no obvious abnormalities on inspection of external nose and ears  NECK: no obvious masses on inspection palpation  Right trunk and right adk with 3 small patches or early vescicles  Doesn't pass midline.  MS: moves all extremities without noticeable focal  abnormality PSYCH: pleasant and cooperative, no obvious depression or anxiety ASSESSMENT AND PLAN:  Discussed the following assessment and plan:  Shingles  Type II or unspecified type diabetes mellitus without mention of complication, not stated as uncontrolled disc med rx plan pain control  Doesn't do well with narcotics  Can try gaba 100 and increase ifr needed  Back to work 4-5 days or as needed  Avoid simple carbs at luch and if getting lows contact us about change in meds  -Patient advised to return or notify  health care team  if symptoms worsen or persist or new concerns arise.  Patient Instructions  Take antiviral medication .   Medication for pain is for nerve pain and can increase to 300 mg 3 x per day.  Shingles Shingles (herpes zoster) is an infection that is caused by the same virus that causes chickenpox (varicella). The infection causes a painful skin rash and fluid-filled blisters, which eventually break open, crust over, and heal. It may occur in any area of the body, but it usually affects only one side of the body or face. The pain of shingles usually lasts about 1 month. However, some people with shingles may develop long-term (chronic) pain in the affected area of the body. Shingles often occurs many years after the person had chickenpox. It is more common:  In people older than 50 years.  In people with weakened immune systems, such as those with HIV, AIDS, or cancer.  In people taking medicines that weaken the immune system, such as transplant medicines.  In people under great stress. CAUSES  Shingles is caused by the varicella zoster virus (VZV), which also causes chickenpox. After a person is infected with the virus, it can remain in the person's body for years in an inactive state (dormant). To cause shingles, the virus reactivates and breaks out as an infection in a nerve root. The virus can be spread from person to person (contagious) through contact with open blisters of the shingles rash. It will only spread to people who have not had chickenpox. When these people are exposed to the virus, they may develop chickenpox. They will not develop shingles. Once the blisters scab over, the person is no longer contagious and cannot spread the virus to others. SYMPTOMS  Shingles shows up in stages. The initial symptoms may be pain, itching, and tingling in an area of the skin. This pain is usually described as burning, stabbing, or throbbing.In a few days or weeks, a painful red rash  will appear in the area where the pain, itching, and tingling were felt. The rash is usually on one side of the body in a band or belt-like pattern. Then, the rash usually turns into fluid-filled blisters. They will scab over and dry up in approximately 2 3 weeks. Flu-like symptoms may also occur with the initial symptoms, the rash, or the blisters. These may include:  Fever.  Chills.  Headache.  Upset stomach. DIAGNOSIS  Your caregiver will perform a skin exam to diagnose shingles. Skin  scrapings or fluid samples may also be taken from the blisters. This sample will be examined under a microscope or sent to a lab for further testing. TREATMENT  There is no specific cure for shingles. Your caregiver will likely prescribe medicines to help you manage the pain, recover faster, and avoid long-term problems. This may include antiviral drugs, anti-inflammatory drugs, and pain medicines. HOME CARE INSTRUCTIONS   Take a cool bath or apply cool compresses to the area of the rash or blisters as directed. This may help with the pain and itching.   Only take over-the-counter or prescription medicines as directed by your caregiver.   Rest as directed by your caregiver.  Keep your rash and blisters clean with mild soap and cool water or as directed by your caregiver.  Do not pick your blisters or scratch your rash. Apply an anti-itch cream or numbing creams to the affected area as directed by your caregiver.  Keep your shingles rash covered with a loose bandage (dressing).  Avoid skin contact with:  Babies.   Pregnant women.   Children with eczema.   Elderly people with transplants.   People with chronic illnesses, such as leukemia or AIDS.   Wear loose-fitting clothing to help ease the pain of material rubbing against the rash.  Keep all follow-up appointments with your caregiver.If the area involved is on your face, you may receive a referral for follow-up to a specialist,  such as an eye doctor (ophthalmologist) or an ear, nose, and throat (ENT) doctor. Keeping all follow-up appointments will help you avoid eye complications, chronic pain, or disability.  SEEK IMMEDIATE MEDICAL CARE IF:   You have facial pain, pain around the eye area, or loss of feeling on one side of your face.  You have ear pain or ringing in your ear.  You have loss of taste.  Your pain is not relieved with prescribed medicines.   Your redness or swelling spreads.   You have more pain and swelling.  Your condition is worsening or has changed.   You have a feveror persistent symptoms for more than 2 3 days.  You have a fever and your symptoms suddenly get worse. MAKE SURE YOU:  Understand these instructions.  Will watch your condition.  Will get help right away if you are not doing well or get worse. Document Released: 02/03/2005 Document Revised: 10/29/2011 Document Reviewed: 09/18/2011 [facility] Patient Information 2014 [place], [place].      Neta Mends. Panosh M.D.

## 2012-12-30 NOTE — Patient Instructions (Signed)
Take antiviral medication .   Medication for pain is for nerve pain and can increase to 300 mg 3 x per day.  Shingles Shingles (herpes zoster) is an infection that is caused by the same virus that causes chickenpox (varicella). The infection causes a painful skin rash and fluid-filled blisters, which eventually break open, crust over, and heal. It may occur in any area of the body, but it usually affects only one side of the body or face. The pain of shingles usually lasts about 1 month. However, some people with shingles may develop long-term (chronic) pain in the affected area of the body. Shingles often occurs many years after the person had chickenpox. It is more common:  In people older than 50 years.  In people with weakened immune systems, such as those with HIV, AIDS, or cancer.  In people taking medicines that weaken the immune system, such as transplant medicines.  In people under great stress. CAUSES  Shingles is caused by the varicella zoster virus (VZV), which also causes chickenpox. After a person is infected with the virus, it can remain in the person's body for years in an inactive state (dormant). To cause shingles, the virus reactivates and breaks out as an infection in a nerve root. The virus can be spread from person to person (contagious) through contact with open blisters of the shingles rash. It will only spread to people who have not had chickenpox. When these people are exposed to the virus, they may develop chickenpox. They will not develop shingles. Once the blisters scab over, the person is no longer contagious and cannot spread the virus to others. SYMPTOMS  Shingles shows up in stages. The initial symptoms may be pain, itching, and tingling in an area of the skin. This pain is usually described as burning, stabbing, or throbbing.In a few days or weeks, a painful red rash will appear in the area where the pain, itching, and tingling were felt. The rash is usually on  one side of the body in a band or belt-like pattern. Then, the rash usually turns into fluid-filled blisters. They will scab over and dry up in approximately 2 3 weeks. Flu-like symptoms may also occur with the initial symptoms, the rash, or the blisters. These may include:  Fever.  Chills.  Headache.  Upset stomach. DIAGNOSIS  Your caregiver will perform a skin exam to diagnose shingles. Skin scrapings or fluid samples may also be taken from the blisters. This sample will be examined under a microscope or sent to a lab for further testing. TREATMENT  There is no specific cure for shingles. Your caregiver will likely prescribe medicines to help you manage the pain, recover faster, and avoid long-term problems. This may include antiviral drugs, anti-inflammatory drugs, and pain medicines. HOME CARE INSTRUCTIONS   Take a cool bath or apply cool compresses to the area of the rash or blisters as directed. This may help with the pain and itching.   Only take over-the-counter or prescription medicines as directed by your caregiver.   Rest as directed by your caregiver.  Keep your rash and blisters clean with mild soap and cool water or as directed by your caregiver.  Do not pick your blisters or scratch your rash. Apply an anti-itch cream or numbing creams to the affected area as directed by your caregiver.  Keep your shingles rash covered with a loose bandage (dressing).  Avoid skin contact with:  Babies.   Pregnant women.   Children with eczema.  Elderly people with transplants.   People with chronic illnesses, such as leukemia or AIDS.   Wear loose-fitting clothing to help ease the pain of material rubbing against the rash.  Keep all follow-up appointments with your caregiver.If the area involved is on your face, you may receive a referral for follow-up to a specialist, such as an eye doctor (ophthalmologist) or an ear, nose, and throat (ENT) doctor. Keeping all  follow-up appointments will help you avoid eye complications, chronic pain, or disability.  SEEK IMMEDIATE MEDICAL CARE IF:   You have facial pain, pain around the eye area, or loss of feeling on one side of your face.  You have ear pain or ringing in your ear.  You have loss of taste.  Your pain is not relieved with prescribed medicines.   Your redness or swelling spreads.   You have more pain and swelling.  Your condition is worsening or has changed.   You have a feveror persistent symptoms for more than 2 3 days.  You have a fever and your symptoms suddenly get worse. MAKE SURE YOU:  Understand these instructions.  Will watch your condition.  Will get help right away if you are not doing well or get worse. Document Released: 02/03/2005 Document Revised: 10/29/2011 Document Reviewed: 09/18/2011 [facility] Patient Information 2014 [place], [place].

## 2013-02-09 ENCOUNTER — Encounter: Payer: Self-pay | Admitting: Internal Medicine

## 2013-02-09 ENCOUNTER — Ambulatory Visit (INDEPENDENT_AMBULATORY_CARE_PROVIDER_SITE_OTHER): Payer: BC Managed Care – PPO | Admitting: Internal Medicine

## 2013-02-09 VITALS — BP 140/86 | HR 76 | Temp 97.9°F | Resp 16 | Ht 70.0 in | Wt 226.0 lb

## 2013-02-09 DIAGNOSIS — L0291 Cutaneous abscess, unspecified: Secondary | ICD-10-CM

## 2013-02-09 DIAGNOSIS — L0390 Cellulitis, unspecified: Secondary | ICD-10-CM

## 2013-02-09 MED ORDER — DOXYCYCLINE HYCLATE 100 MG PO CAPS: 100.0000 mg | ORAL_CAPSULE | Freq: Two times a day (BID) | ORAL | Status: DC

## 2013-02-09 NOTE — Patient Instructions (Signed)
This is a resolving cellulitis   And fortunately   Doesn't seem to involve the joint.  At this time which is fortunate.   Continue warm compresses.   And add antibiotic  See care immediatly if getting worse again. Get some padding or elbow sleeve or  support for elbow protection .  Send [place] a report in 1-2 weeks and picture as discussed.

## 2013-02-09 NOTE — Progress Notes (Signed)
Pre visit review using our clinic review tool, if applicable. No additional management support is needed unless otherwise documented below in the visit note. 

## 2013-02-09 NOTE — Progress Notes (Signed)
Chief Complaint  Patient presents with  . left elbow cellulitis    for 2 weeks with some inprovement    HPI: Patient comes in today for SDA for  new problem evaluation. Here with his wife Onset 2-3 weeks ago  Redness and sore the whole left arm without a specific injury. Used epsoms salt and neosprin.  Then drained on its and off an on for last 2 weeks  Until 2-3 days ago clear and blooding and some whit stuff.  And then healed  now is not tender but still swollen good range of motion. 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 No falling or other injury. No systemic symptoms such as fever or unusual sweats.  ROS: See pertinent positives and negatives per HPI. Shingles has resolved he is off medication now.  Past Medical History  Diagnosis Date  . Allergic rhinitis   . Asthma   . Depression   . GERD (gastroesophageal reflux disease)   . Gout   . Hyperlipidemia   . Hypertension   . Trauma     hx of falling trauma and back pain with side effects of medication, parkisonian now resolved  . Salivary stone   . ADVERSE REACTION TO MEDICATION 10/09/2008    Qualifier: Diagnosis of  By: Fabian Sharp MD, Neta Mends     Family History  Problem Relation Age of Onset  . Adopted: Yes    History   Social History  . Marital Status: Married    Spouse Name: N/A    Number of Children: N/A  . Years of Education: N/A   Social History Main Topics  . Smoking status: Never Smoker   . Smokeless tobacco: None  . Alcohol Use: No  . Drug Use: No  . Sexual Activity:    Other Topics Concern  . None   Social History Publishing copy  Days week at a time and home for weekends [employer]    Married    Regular exercise- no   Son   To leave [employer] in a week and be at home   [facility] of 4   9 dogs dog had puppies   8-10 hours per night sleep   New grand child GD     Outpatient Encounter Prescriptions as of 02/09/2013  Medication Sig  . APPLE CIDER VINEGAR PO Take by  mouth.    . cholecalciferol (VITAMIN D) 1000 UNITS tablet Take 1,000 Units by mouth 3 (three) times daily.  . fish oil-omega-3 fatty acids 1000 MG capsule Take 2 g by mouth daily.  Marland Kitchen gabapentin (NEURONTIN) 100 MG capsule Take 1 capsule (100 mg total) by mouth 3 (three) times daily. FOR PAIN AND CAN INCREASE TO 300 MG PER DOSE AS DIRECTED  . glimepiride (AMARYL) 2 MG tablet TAKE 1 TABLET (2 MG TOTAL) BY MOUTH DAILY BEFORE BREAKFAST.  Marland Kitchen glucose blood (TRUETEST TEST) test strip Use twice a day. Pt wants True Balance Test Strips  . lisinopril (PRINIVIL,ZESTRIL) 10 MG tablet TAKE 1 TABLET (10 MG TOTAL) BY MOUTH DAILY.  . metFORMIN (GLUCOPHAGE-XR) 500 MG 24 hr tablet Take 2 tablets (1,000 mg total) by mouth daily with breakfast.  . Multiple Vitamin (MULTIVITAMIN) capsule Take 1 capsule by mouth daily.  . sertraline (ZOLOFT) 100 MG tablet TAKE 1 TABLET (100 MG TOTAL) BY MOUTH DAILY.  . valACYclovir (VALTREX) 1000 MG tablet Take 1 tablet (1,000 mg total) by mouth 3 (three) times daily. FOR SHINGLES  .  doxycycline (VIBRAMYCIN) 100 MG capsule Take 1 capsule (100 mg total) by mouth 2 (two) times daily.  . [DISCONTINUED] lisinopril (PRINIVIL,ZESTRIL) 10 MG tablet TAKE 1 TABLET BY MOUTH DAILY    EXAM:  BP 140/86  Pulse 76  Temp(Src) 97.9 F (36.6 C)  Resp 16  Ht 5\' 10"  (1.778 m)  Wt 226 lb (102.513 kg)  BMI 32.43 kg/m2  Body mass index is 32.43 kg/(m^2).  GENERAL: vitals reviewed and listed above, alert, oriented, appears well hydrated and in no acute distress  MS: moves all extremities left infra-elbow area shows about a 4 cm mildly soft pink area nontender with a healing scab. This is a bit lateral to his olecranon bursa. Normal range of motion of elbow. Normal grip. No adenopathy or streaking  PSYCH: pleasant and cooperative, no obvious depression or anxiety  ASSESSMENT AND PLAN:  Discussed the following assessment and plan:  Cellulitis - Is likely resolving this seems to be lateral to  his olecranon bursa nevertheless it is getting better and nontender but would add antibiotic because of risk avo To avoid repeated trauma expectant management followup visit or snapshot picture of how he is doing in one to 2 weeks. Discussed joint protection etc. It certainly sounds that he had a significant infection that is resolving itself. We'll add doxycycline at this time. -Patient advised to return or notify health care team  if symptoms worsen or persist or new concerns arise.  Patient Instructions  This is a resolving cellulitis   And fortunately   Doesn't seem to involve the joint.  At this time which is fortunate.   Continue warm compresses.   And add antibiotic  See care immediatly if getting worse again. Get some padding or elbow sleeve or  support for elbow protection .  Send [place] a report in 1-2 weeks and picture as discussed.       Neta Mends. Panosh M.D.

## 2013-03-18 ENCOUNTER — Other Ambulatory Visit: Payer: Self-pay | Admitting: Internal Medicine

## 2013-03-29 ENCOUNTER — Other Ambulatory Visit: Payer: Self-pay | Admitting: Internal Medicine

## 2013-05-08 ENCOUNTER — Other Ambulatory Visit: Payer: Self-pay | Admitting: Internal Medicine

## 2013-05-23 ENCOUNTER — Encounter: Payer: Self-pay | Admitting: Internal Medicine

## 2013-05-23 ENCOUNTER — Ambulatory Visit (INDEPENDENT_AMBULATORY_CARE_PROVIDER_SITE_OTHER): Payer: BC Managed Care – PPO | Admitting: Internal Medicine

## 2013-05-23 VITALS — BP 126/84 | Temp 97.9°F | Ht 72.0 in | Wt 230.0 lb

## 2013-05-23 DIAGNOSIS — E119 Type 2 diabetes mellitus without complications: Secondary | ICD-10-CM

## 2013-05-23 DIAGNOSIS — Z79899 Other long term (current) drug therapy: Secondary | ICD-10-CM | POA: Insufficient documentation

## 2013-05-23 DIAGNOSIS — M79609 Pain in unspecified limb: Secondary | ICD-10-CM

## 2013-05-23 DIAGNOSIS — M79671 Pain in right foot: Secondary | ICD-10-CM | POA: Insufficient documentation

## 2013-05-23 LAB — HEMOGLOBIN A1C: Hgb A1c MFr Bld: 7.2 % — ABNORMAL HIGH (ref 4.6–6.5)

## 2013-05-23 LAB — GLUCOSE, POCT (MANUAL RESULT ENTRY): POC Glucose: 129 mg/dl — AB (ref 70–99)

## 2013-05-23 MED ORDER — METFORMIN HCL ER 500 MG PO TB24: ORAL_TABLET | ORAL | Status: DC

## 2013-05-23 MED ORDER — GLIMEPIRIDE 2 MG PO TABS: 1.0000 mg | ORAL_TABLET | Freq: Every day | ORAL | Status: DC | PRN

## 2013-05-23 NOTE — Patient Instructions (Addendum)
Increase  The metformin  To 1500 mg per day  Or 3  500 per day  Ina month  May consider increasing to 4- 500 per day    If needed we can add on 1/2 tablet   ( of the 2 mg)    glimeprimide   You are due for labs  Get hgb a1c today.  then plan folllow up.   Call in  Glucose readings after a month and then we can make a plan.

## 2013-05-23 NOTE — Progress Notes (Signed)
Chief Complaint  Patient presents with  . Follow-up    HPI:  FU dm and has of low bg  Told to stop the SU med usually late after noon   Last a1c was 6.9 10 24    Due for fu  anyway   Went back on med  After a few weeks .cause sugars up to 160 range    Currently no lows  Working on diet and ls.   Right toe foot hurts off and on no trauma no numbness .  ROS: See pertinent positives and negatives per HPI. No cv pulm sx   Past Medical History  Diagnosis Date  . Allergic rhinitis   . Asthma   . Depression   . GERD (gastroesophageal reflux disease)   . Gout   . Hyperlipidemia   . Hypertension   . Trauma     hx of falling trauma and back pain with side effects of medication, parkisonian now resolved  . Salivary stone   . ADVERSE REACTION TO MEDICATION 10/09/2008    Qualifier: Diagnosis of  By: Fabian SharpPanosh MD, Neta MendsWanda K     Family History  Problem Relation Age of Onset  . Adopted: Yes    History   Social History  . Marital Status: Married    Spouse Name: N/A    Number of Children: N/A  . Years of Education: N/A   Social History Main Topics  . Smoking status: Never Smoker   . Smokeless tobacco: None  . Alcohol Use: No  . Drug Use: No  . Sexual Activity:    Other Topics Concern  . 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 APPLE CIDER VINEGAR PO Take by mouth.    . cholecalciferol (VITAMIN D) 1000 UNITS tablet Take 1,000 Units by mouth daily.   Marland Kitchen. glimepiride (AMARYL) 2 MG tablet TAKE 1 TABLET (2 MG TOTAL) BY MOUTH DAILY BEFORE BREAKFAST.  Marland Kitchen. glucose blood (TRUETEST TEST) test strip Use twice a day. Pt wants True Balance Test Strips  . lisinopril  (PRINIVIL,ZESTRIL) 10 MG tablet TAKE 1 TABLET BY MOUTH DAILY  . metFORMIN (GLUCOPHAGE-XR) 500 MG 24 hr tablet Take 3 per day as directed  . Multiple Vitamin (MULTIVITAMIN) capsule Take 1 capsule by mouth daily.  . sertraline (ZOLOFT) 100 MG tablet TAKE 1 TABLET (100 MG TOTAL) BY MOUTH DAILY.  . [DISCONTINUED] metFORMIN (GLUCOPHAGE-XR) 500 MG 24 hr tablet TAKE 2 TABLETS (1,000 MG TOTAL) BY MOUTH DAILY WITH BREAKFAST.  . [DISCONTINUED] metFORMIN (GLUCOPHAGE-XR) 500 MG 24 hr tablet TAKE 2 TABLETS (1,000 MG TOTAL) BY MOUTH DAILY IN THE EVENING.  . [DISCONTINUED] doxycycline (VIBRAMYCIN) 100 MG capsule Take 1 capsule (100 mg total) by mouth 2 (two) times daily.  . [DISCONTINUED] fish oil-omega-3 fatty acids 1000 MG capsule Take 2 g by mouth daily.  . [DISCONTINUED] gabapentin (NEURONTIN) 100 MG capsule Take 1 capsule (100 mg total) by mouth 3 (three) times daily. FOR PAIN AND CAN INCREASE TO 300 MG PER DOSE AS DIRECTED  . [DISCONTINUED] lisinopril (PRINIVIL,ZESTRIL) 10  MG tablet TAKE 1 TABLET (10 MG TOTAL) BY MOUTH DAILY.  . [DISCONTINUED] sertraline (ZOLOFT) 100 MG tablet TAKE 1 TABLET (100 MG TOTAL) BY MOUTH DAILY.  . [DISCONTINUED] valACYclovir (VALTREX) 1000 MG tablet Take 1 tablet (1,000 mg total) by mouth 3 (three) times daily. FOR SHINGLES    EXAM:  BP 126/84  Temp(Src) 97.9 F (36.6 C) (Oral)  Ht 6' (1.829 m)  Wt 230 lb (104.327 kg)  BMI 31.19 kg/m2  Body mass index is 31.19 kg/(m^2).  GENERAL: vitals reviewed and listed above, alert, oriented, appears well hydrated and in no acute distress HEENT: atraumatic, conjunctiva  clear, no obvious abnormalities on inspection of external nose and earsCV: HRRR, no clubbing cyanosis or  peripheral edema nl cap refill  MS: moves all extremities without noticeable focal  Abnormality right foot nl pulse and intact neuro except between 1 adn second toe (old surgery) PSYCH: pleasant and cooperative, no obvious depression or anxiety Foot exam ok    ASSESSMENT AND PLAN:  Discussed the following assessment and plan:  Type II or unspecified type diabetes mellitus without mention of complication, not stated as uncontrolled - Plan: POC Glucose (CBG), Hemoglobin A1c Hx of afternoon lows with SU    Continue lsi and inc metformin and add on su 1/2 dose as needed.  consdier increase metformin if tolerated.  a1c today  -Patient advised to return or notify health care team  if symptoms worsen ,persist or new concerns arise.  Patient Instructions  Increase  The metformin  To 1500 mg per day  Or 3  500 per day  Ina month  May consider increasing to 4- 500 per day    If needed we can add on 1/2 tablet   ( of the 2 mg)    glimeprimide   You are due for labs  Get hgb a1c today.  then plan folllow up.   Call in  Glucose readings after a month and then we can make a plan.      Neta Mends. Lemuel Boodram M.D.  Pre visit review using our clinic review tool, if applicable. No additional management support is needed unless otherwise documented below in the visit note.

## 2013-06-10 ENCOUNTER — Other Ambulatory Visit: Payer: Self-pay | Admitting: Family Medicine

## 2013-06-10 ENCOUNTER — Telehealth: Payer: Self-pay | Admitting: Family Medicine

## 2013-06-10 ENCOUNTER — Telehealth: Payer: Self-pay

## 2013-06-10 MED ORDER — SERTRALINE HCL 100 MG PO TABS: ORAL_TABLET | ORAL | Status: DC

## 2013-06-10 MED ORDER — LISINOPRIL 10 MG PO TABS: ORAL_TABLET | ORAL | Status: DC

## 2013-06-10 MED ORDER — LORAZEPAM 0.5 MG PO TABS: ORAL_TABLET | ORAL | Status: DC

## 2013-06-10 NOTE — Telephone Encounter (Signed)
Relevant patient education mailed to patient.  
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
## 2013-06-10 NOTE — Telephone Encounter (Signed)
Sent to the pharmacies per instructions.  Patient's wife notified.

## 2013-06-10 NOTE — Telephone Encounter (Signed)
Ok to rx  Ativan   0.5 mg  Take 1-2 up to q 8 hours as needed for anxiety .  Disp # 30 no refilll  Ok to refill other meds for 6 months

## 2013-06-17 ENCOUNTER — Other Ambulatory Visit: Payer: Self-pay | Admitting: Internal Medicine

## 2013-07-17 ENCOUNTER — Other Ambulatory Visit: Payer: Self-pay | Admitting: Internal Medicine

## 2013-07-19 NOTE — Telephone Encounter (Signed)
Sent to the pharmacy by e-scribe. 

## 2013-10-28 ENCOUNTER — Other Ambulatory Visit: Payer: Self-pay | Admitting: Internal Medicine

## 2013-10-28 NOTE — Telephone Encounter (Signed)
Sent to the pharmacy by e-scribe. 

## 2013-10-28 NOTE — Telephone Encounter (Signed)
Ok x 6 months 

## 2013-12-12 ENCOUNTER — Other Ambulatory Visit: Payer: Self-pay | Admitting: Internal Medicine

## 2013-12-13 NOTE — Telephone Encounter (Signed)
#  90 [facility]  Sent to the pharmacy by e-scribe.

## 2014-01-07 ENCOUNTER — Other Ambulatory Visit: Payer: Self-pay | Admitting: Internal Medicine

## 2014-01-09 NOTE — Telephone Encounter (Signed)
Sent to the pharmacy by e-scribe. 

## 2014-03-31 ENCOUNTER — Encounter: Payer: Self-pay | Admitting: Internal Medicine

## 2014-03-31 ENCOUNTER — Ambulatory Visit (INDEPENDENT_AMBULATORY_CARE_PROVIDER_SITE_OTHER): Payer: PRIVATE HEALTH INSURANCE | Admitting: Internal Medicine

## 2014-03-31 VITALS — BP 132/89 | Temp 97.8°F | Ht 72.0 in | Wt 221.4 lb

## 2014-03-31 DIAGNOSIS — E119 Type 2 diabetes mellitus without complications: Secondary | ICD-10-CM

## 2014-03-31 DIAGNOSIS — Z79899 Other long term (current) drug therapy: Secondary | ICD-10-CM

## 2014-03-31 DIAGNOSIS — I10 Essential (primary) hypertension: Secondary | ICD-10-CM

## 2014-03-31 DIAGNOSIS — E785 Hyperlipidemia, unspecified: Secondary | ICD-10-CM

## 2014-03-31 LAB — CBC WITH DIFFERENTIAL/PLATELET
BASOS ABS: 0 10*3/uL (ref 0.0–0.1)
BASOS PCT: 0.8 % (ref 0.0–3.0)
EOS ABS: 0.2 10*3/uL (ref 0.0–0.7)
Eosinophils Relative: 2.9 % (ref 0.0–5.0)
HCT: 43.7 % (ref 39.0–52.0)
Hemoglobin: 14.9 g/dL (ref 13.0–17.0)
LYMPHS PCT: 41.6 % (ref 12.0–46.0)
Lymphs Abs: 2.5 10*3/uL (ref 0.7–4.0)
MCHC: 34 g/dL (ref 30.0–36.0)
MCV: 88.4 fl (ref 78.0–100.0)
Monocytes Absolute: 0.4 10*3/uL (ref 0.1–1.0)
Monocytes Relative: 6.2 % (ref 3.0–12.0)
Neutro Abs: 2.9 10*3/uL (ref 1.4–7.7)
Neutrophils Relative %: 48.5 % (ref 43.0–77.0)
PLATELETS: 219 10*3/uL (ref 150.0–400.0)
RBC: 4.94 Mil/uL (ref 4.22–5.81)
RDW: 13.4 % (ref 11.5–15.5)
WBC: 6 10*3/uL (ref 4.0–10.5)

## 2014-03-31 LAB — LIPID PANEL
CHOL/HDL RATIO: 6
Cholesterol: 189 mg/dL (ref 0–200)
HDL: 32.7 mg/dL — ABNORMAL LOW (ref >39.00)
NONHDL: 156.3
Triglycerides: 273 mg/dL — ABNORMAL HIGH (ref 0.0–149.0)
VLDL: 54.6 mg/dL — ABNORMAL HIGH (ref 0.0–40.0)

## 2014-03-31 LAB — BASIC METABOLIC PANEL
BUN: 14 mg/dL (ref 6–23)
CALCIUM: 9.3 mg/dL (ref 8.4–10.5)
CO2: 28 mEq/L (ref 19–32)
Chloride: 102 mEq/L (ref 96–112)
Creatinine, Ser: 0.94 mg/dL (ref 0.40–1.50)
GFR: 88.81 mL/min (ref >60.00)
GLUCOSE: 157 mg/dL — AB (ref 70–99)
Potassium: 4.1 mEq/L (ref 3.5–5.1)
SODIUM: 137 meq/L (ref 135–145)

## 2014-03-31 LAB — HEPATIC FUNCTION PANEL
ALBUMIN: 4.3 g/dL (ref 3.5–5.2)
ALK PHOS: 46 U/L (ref 39–117)
ALT: 36 U/L (ref 0–53)
AST: 27 U/L (ref 0–37)
Bilirubin, Direct: 0.1 mg/dL (ref 0.0–0.3)
TOTAL PROTEIN: 7.6 g/dL (ref 6.0–8.3)
Total Bilirubin: 0.5 mg/dL (ref 0.2–1.2)

## 2014-03-31 LAB — MICROALBUMIN / CREATININE URINE RATIO
Creatinine,U: 147.1 mg/dL
MICROALB/CREAT RATIO: 0.5 mg/g (ref 0.0–30.0)
Microalb, Ur: <0.7 mg/dL (ref 0.0–1.9)

## 2014-03-31 LAB — GLUCOSE, POCT (MANUAL RESULT ENTRY): POC GLUCOSE: 159 mg/dL — AB (ref 70–99)

## 2014-03-31 LAB — LDL CHOLESTEROL, DIRECT: Direct LDL: 121 mg/dL

## 2014-03-31 LAB — TSH: TSH: 1.32 u[IU]/mL (ref 0.35–4.50)

## 2014-03-31 LAB — HEMOGLOBIN A1C: Hgb A1c MFr Bld: 7.8 % — ABNORMAL HIGH (ref 4.6–6.5)

## 2014-03-31 MED ORDER — SERTRALINE HCL 100 MG PO TABS: ORAL_TABLET | ORAL | Status: DC

## 2014-03-31 MED ORDER — LISINOPRIL 10 MG PO TABS: 10.0000 mg | ORAL_TABLET | Freq: Every day | ORAL | Status: DC

## 2014-03-31 MED ORDER — METFORMIN HCL ER 500 MG PO TB24: ORAL_TABLET | ORAL | Status: DC
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
## 2014-03-31 NOTE — Addendum Note (Signed)
Addended by: Raj JanusADKINS, Govani Radloff T on: 03/31/2014 04:23 PM   Modules accepted: Orders, Medications

## 2014-03-31 NOTE — Patient Instructions (Signed)
Continue lifestyle intervention healthy eating and exercise . Will notify you  of labs when available.  Goal fasting below 120 Goal 2 hours after eating  Below 180  Acceptable but best 140- 150 and below    Diabetes and Standards of Medical Care Diabetes is complicated. You may find that your diabetes team includes a dietitian, nurse, diabetes educator, eye doctor, and more. To help everyone know what is going on and to help you get the care you deserve, the following schedule of care was developed to help keep you on track. Below are the tests, exams, vaccines, medicines, education, and plans you will need. HbA1c test This test shows how well you have controlled your glucose over the past 2-3 months. It is used to see if your diabetes management plan needs to be adjusted.   It is performed at least 2 times a year if you are meeting treatment goals.  It is performed 4 times a year if therapy has changed or if you are not meeting treatment goals. Blood pressure test  This test is performed at every routine medical visit. The goal is less than 140/90 mm Hg for most people, but 130/80 mm Hg in some cases. Ask your health care provider about your goal. Dental exam  Follow up with the dentist regularly. Eye exam  If you are diagnosed with type 1 diabetes as a child, get an exam upon reaching the age of 98 years or older and have had diabetes for 3-5 years. Yearly eye exams are recommended after that initial eye exam.  If you are diagnosed with type 1 diabetes as an adult, get an exam within 5 years of diagnosis and then yearly.  If you are diagnosed with type 2 diabetes, get an exam as soon as possible after the diagnosis and then yearly. Foot care exam  Visual foot exams are performed at every routine medical visit. The exams check for cuts, injuries, or other problems with the feet.  A comprehensive foot exam should be done yearly. This includes visual inspection as well as assessing  foot pulses and testing for loss of sensation.  Check your feet nightly for cuts, injuries, or other problems with your feet. Tell your health care provider if anything is not healing. Kidney function test (urine microalbumin)  This test is performed once a year.  Type 1 diabetes: The first test is performed 5 years after diagnosis.  Type 2 diabetes: The first test is performed at the time of diagnosis.  A serum creatinine and estimated glomerular filtration rate (eGFR) test is done once a year to assess the level of chronic kidney disease (CKD), if present. Lipid profile (cholesterol, HDL, LDL, triglycerides)  Performed every 5 years for most people.  The goal for LDL is less than 100 mg/dL. If you are at high risk, the goal is less than 70 mg/dL.  The goal for HDL is 40 mg/dL-50 mg/dL for men and 50 mg/dL-60 mg/dL for women. An HDL cholesterol of 60 mg/dL or higher gives some protection against heart disease.  The goal for triglycerides is less than 150 mg/dL. Influenza vaccine, pneumococcal vaccine, and hepatitis B vaccine  The influenza vaccine is recommended yearly.  It is recommended that people with diabetes who are over 63 years old get the pneumonia vaccine. In some cases, two separate shots may be given. Ask your health care provider if your pneumonia vaccination is up to date.  The hepatitis B vaccine is also recommended for adults with  diabetes. Diabetes self-management education  Education is recommended at diagnosis and ongoing as needed. Treatment plan  Your treatment plan is reviewed at every medical visit. Document Released: 12/01/2008 Document Revised: 06/20/2013 Document Reviewed: 07/06/2012 [facility] Patient Information 2015 [place], [place]. This information is not intended to replace advice given to you by your health care provider. Make sure you discuss any questions you have with your health care provider.

## 2014-04-03 ENCOUNTER — Telehealth: Payer: Self-pay | Admitting: Internal Medicine

## 2014-04-03 NOTE — Telephone Encounter (Signed)
emmi mailed  °

## 2014-06-09 ENCOUNTER — Other Ambulatory Visit: Payer: Self-pay | Admitting: Internal Medicine

## 2014-06-09 NOTE — Telephone Encounter (Signed)
Sent to the pharmacy by e-scribe. 

## 2014-08-13 ENCOUNTER — Ambulatory Visit (INDEPENDENT_AMBULATORY_CARE_PROVIDER_SITE_OTHER): Payer: Self-pay | Admitting: Family Medicine

## 2014-08-13 VITALS — BP 136/86 | HR 97 | Temp 97.9°F | Resp 18 | Ht 72.0 in | Wt 216.0 lb

## 2014-08-13 DIAGNOSIS — Z021 Encounter for pre-employment examination: Secondary | ICD-10-CM

## 2014-08-13 DIAGNOSIS — Z024 Encounter for examination for driving license: Secondary | ICD-10-CM

## 2014-08-13 NOTE — Patient Instructions (Signed)
1 year card for blood pressure and diabetes.
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
## 2014-08-15 ENCOUNTER — Telehealth: Payer: Self-pay

## 2014-08-15 NOTE — Telephone Encounter (Signed)
Patient is calling because he requested that we fax his DOT form and the company never received a fax. Please resend! [employer] Attn: Ms. Richardson DoppCole. Fax# [phone]

## 2014-08-17 NOTE — Telephone Encounter (Signed)
All DOT forms and cards (personal and IA) go to [employer] for review. Since this DOT was personal, it has to be scanned into Epic. Form not yet scanned. Will check [employer] to see if they still have the form or if it was already sent for scanning.

## 2014-08-22 NOTE — Telephone Encounter (Signed)
Spoke with Marylene LandAngela, Software engineerbilling manager at [employer]. She states she has tried to fax DOT long form multiple times and they've all failed. She called patient and was given an email address to send information, but that didn't work either. I now have the DOT information and will try to refax again today.

## 2014-08-22 NOTE — Telephone Encounter (Signed)
Faxed form twice and both faxed failed.

## 2014-08-23 NOTE — Telephone Encounter (Signed)
Chart returned to GeorgetownAngela at [employer] yesterday. She will see if she can mail information to Ms. Scott Pineda at [employer] or mail to patient.

## 2014-08-28 ENCOUNTER — Telehealth: Payer: Self-pay | Admitting: Family Medicine

## 2014-08-28 NOTE — Telephone Encounter (Signed)
Patient's wife called.  She stated that she spoke to Scott Pineda and he stated that his blood sugars have been running high.  Wants to increase his metformin   Diane stated this would be okay if reported bg is running high.  She did not leave me any numbers.  Please advise.  Thanks!

## 2014-08-28 NOTE — Telephone Encounter (Signed)
Tried to reach Scott Pineda.  Her mailbox is full.  Will try again at a later time.

## 2014-08-28 NOTE — Telephone Encounter (Signed)
Disc with wife previously  Ok to increase to 4 x 500 mg per day metformin and then can come in.

## 2014-08-30 MED ORDER — METFORMIN HCL ER 500 MG PO TB24: ORAL_TABLET | ORAL | Status: DC

## 2014-08-30 NOTE — Addendum Note (Signed)
Addended by: Raj JanusADKINS, MISTY T on: 08/30/2014 09:04 AM   Modules accepted: Orders

## 2014-08-30 NOTE — Telephone Encounter (Signed)
Spoke to Diane and advised that the pt take four tablets daily.

## 2014-10-09 ENCOUNTER — Other Ambulatory Visit: Payer: Self-pay | Admitting: Family Medicine

## 2014-10-10 NOTE — Telephone Encounter (Signed)
Refilled on 08/30/14 for three months.  Request is too early.

## 2014-11-20 ENCOUNTER — Other Ambulatory Visit: Payer: Self-pay | Admitting: Internal Medicine

## 2014-11-21 ENCOUNTER — Telehealth: Payer: Self-pay | Admitting: Family Medicine

## 2014-11-21 NOTE — Telephone Encounter (Signed)
Sent to the pharmacy by e-scribe.  Pt is past due for follow up.  Will send a message to scheduling.

## 2014-11-21 NOTE — Telephone Encounter (Signed)
Patient is past due for follow up of diabetes/A1C.  Please schedule.  Thanks!

## 2014-11-22 NOTE — Telephone Encounter (Signed)
Pt will have wife callback to schedule

## 2014-12-11 ENCOUNTER — Encounter: Payer: Self-pay | Admitting: Internal Medicine

## 2014-12-11 ENCOUNTER — Ambulatory Visit (INDEPENDENT_AMBULATORY_CARE_PROVIDER_SITE_OTHER): Payer: PRIVATE HEALTH INSURANCE | Admitting: Internal Medicine

## 2014-12-11 VITALS — BP 134/86 | Temp 98.2°F | Wt 215.0 lb

## 2014-12-11 DIAGNOSIS — E785 Hyperlipidemia, unspecified: Secondary | ICD-10-CM

## 2014-12-11 DIAGNOSIS — Z79899 Other long term (current) drug therapy: Secondary | ICD-10-CM

## 2014-12-11 DIAGNOSIS — E119 Type 2 diabetes mellitus without complications: Secondary | ICD-10-CM

## 2014-12-11 DIAGNOSIS — I10 Essential (primary) hypertension: Secondary | ICD-10-CM

## 2014-12-11 LAB — BASIC METABOLIC PANEL
BUN: 15 mg/dL (ref 6–23)
CO2: 29 mEq/L (ref 19–32)
Calcium: 10.1 mg/dL (ref 8.4–10.5)
Chloride: 100 mEq/L (ref 96–112)
Creatinine, Ser: 1 mg/dL (ref 0.40–1.50)
GFR: 82.47 mL/min (ref >60.00)
Glucose, Bld: 114 mg/dL — ABNORMAL HIGH (ref 70–99)
Potassium: 4 mEq/L (ref 3.5–5.1)
SODIUM: 139 meq/L (ref 135–145)

## 2014-12-11 LAB — HEMOGLOBIN A1C: Hgb A1c MFr Bld: 6.9 % — ABNORMAL HIGH (ref 4.6–6.5)

## 2014-12-11 LAB — MICROALBUMIN / CREATININE URINE RATIO
CREATININE, U: 135 mg/dL
Microalb Creat Ratio: 0.5 mg/g (ref 0.0–30.0)
Microalb, Ur: <0.7 mg/dL (ref 0.0–1.9)
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
## 2014-12-11 NOTE — Patient Instructions (Signed)
Will notify you  of labs when available. Continue   Medication.

## 2014-12-21 ENCOUNTER — Other Ambulatory Visit: Payer: Self-pay | Admitting: Internal Medicine

## 2014-12-22 NOTE — Telephone Encounter (Signed)
Sent to the pharmacy by e-scribe. 

## 2015-01-22 ENCOUNTER — Other Ambulatory Visit: Payer: Self-pay | Admitting: Internal Medicine

## 2015-01-22 NOTE — Telephone Encounter (Signed)
Sent to the pharmacy by e-scribe. 

## 2015-01-23 ENCOUNTER — Other Ambulatory Visit: Payer: Self-pay | Admitting: Family Medicine

## 2015-01-23 MED ORDER — LISINOPRIL 10 MG PO TABS: 10.0000 mg | ORAL_TABLET | Freq: Every day | ORAL | Status: DC

## 2015-01-23 MED ORDER — SERTRALINE HCL 100 MG PO TABS: ORAL_TABLET | ORAL | Status: DC

## 2015-01-23 NOTE — Telephone Encounter (Signed)
Sent to the pharmacy by e-scribe. 

## 2015-03-09 ENCOUNTER — Other Ambulatory Visit: Payer: Self-pay | Admitting: Internal Medicine

## 2015-04-20 ENCOUNTER — Telehealth: Payer: Self-pay | Admitting: Family Medicine

## 2015-04-20 ENCOUNTER — Other Ambulatory Visit: Payer: Self-pay | Admitting: Internal Medicine

## 2015-04-20 ENCOUNTER — Other Ambulatory Visit: Payer: Self-pay | Admitting: Family Medicine

## 2015-04-20 DIAGNOSIS — E119 Type 2 diabetes mellitus without complications: Secondary | ICD-10-CM

## 2015-04-20 DIAGNOSIS — Z0000 Encounter for general adult medical examination without abnormal findings: Secondary | ICD-10-CM

## 2015-04-20 DIAGNOSIS — E1165 Type 2 diabetes mellitus with hyperglycemia: Secondary | ICD-10-CM

## 2015-04-20 DIAGNOSIS — IMO0001 Reserved for inherently not codable concepts without codable children: Secondary | ICD-10-CM | POA: Insufficient documentation

## 2015-04-20 NOTE — Telephone Encounter (Signed)
Pt due for cpx and labs end of April.  I have placed the lab orders. Please help him to make cpx and lab appointments.   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  Please work with him on schedule.  Thanks!

## 2015-04-20 NOTE — Telephone Encounter (Signed)
Sent to the pharmacy by e-scribe.  Message sent to scheduling to help him make cpx and lab appointments in April.

## 2015-04-23 NOTE — Telephone Encounter (Signed)
Pt decline to see cpx. Pt would misty to return his call

## 2015-04-24 NOTE — Telephone Encounter (Signed)
Pt has agreed to follow up.  Please make 30 minutes and help him schedule

## 2015-04-24 NOTE — Telephone Encounter (Signed)
Pt has been sch

## 2015-05-27 DIAGNOSIS — Z789 Other specified health status: Secondary | ICD-10-CM | POA: Insufficient documentation
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
## 2015-05-28 ENCOUNTER — Ambulatory Visit (INDEPENDENT_AMBULATORY_CARE_PROVIDER_SITE_OTHER): Payer: PRIVATE HEALTH INSURANCE | Admitting: Internal Medicine

## 2015-05-28 ENCOUNTER — Encounter: Payer: Self-pay | Admitting: Internal Medicine

## 2015-05-28 VITALS — BP 127/83 | HR 83 | Temp 98.0°F | Ht 72.0 in | Wt 215.0 lb

## 2015-05-28 DIAGNOSIS — E119 Type 2 diabetes mellitus without complications: Secondary | ICD-10-CM | POA: Diagnosis not present

## 2015-05-28 DIAGNOSIS — Z79899 Other long term (current) drug therapy: Secondary | ICD-10-CM

## 2015-05-28 DIAGNOSIS — N200 Calculus of kidney: Secondary | ICD-10-CM

## 2015-05-28 DIAGNOSIS — Z8739 Personal history of other diseases of the musculoskeletal system and connective tissue: Secondary | ICD-10-CM

## 2015-05-28 DIAGNOSIS — I10 Essential (primary) hypertension: Secondary | ICD-10-CM

## 2015-05-28 DIAGNOSIS — Z889 Allergy status to unspecified drugs, medicaments and biological substances status: Secondary | ICD-10-CM

## 2015-05-28 DIAGNOSIS — Z8639 Personal history of other endocrine, nutritional and metabolic disease: Secondary | ICD-10-CM

## 2015-05-28 DIAGNOSIS — E785 Hyperlipidemia, unspecified: Secondary | ICD-10-CM

## 2015-05-28 DIAGNOSIS — Z789 Other specified health status: Secondary | ICD-10-CM

## 2015-05-28 LAB — LIPID PANEL
CHOLESTEROL: 226 mg/dL — AB (ref 0–200)
HDL: 34.1 mg/dL — AB (ref >39.00)
NonHDL: 191.88
TRIGLYCERIDES: 250 mg/dL — AB (ref 0.0–149.0)
Total CHOL/HDL Ratio: 7
VLDL: 50 mg/dL — AB (ref 0.0–40.0)

## 2015-05-28 LAB — BASIC METABOLIC PANEL
BUN: 21 mg/dL (ref 6–23)
CHLORIDE: 99 meq/L (ref 96–112)
CO2: 27 meq/L (ref 19–32)
Calcium: 10.1 mg/dL (ref 8.4–10.5)
Creatinine, Ser: 1.03 mg/dL (ref 0.40–1.50)
GFR: 79.57 mL/min (ref >60.00)
Glucose, Bld: 159 mg/dL — ABNORMAL HIGH (ref 70–99)
POTASSIUM: 4.1 meq/L (ref 3.5–5.1)
Sodium: 137 mEq/L (ref 135–145)

## 2015-05-28 LAB — CBC WITH DIFFERENTIAL/PLATELET
BASOS PCT: 0.5 % (ref 0.0–3.0)
Basophils Absolute: 0 10*3/uL (ref 0.0–0.1)
EOS PCT: 2.5 % (ref 0.0–5.0)
Eosinophils Absolute: 0.2 10*3/uL (ref 0.0–0.7)
HEMATOCRIT: 45.3 % (ref 39.0–52.0)
HEMOGLOBIN: 15.7 g/dL (ref 13.0–17.0)
Lymphocytes Relative: 44.1 % (ref 12.0–46.0)
Lymphs Abs: 3 10*3/uL (ref 0.7–4.0)
MCHC: 34.6 g/dL (ref 30.0–36.0)
MCV: 89 fl (ref 78.0–100.0)
MONO ABS: 0.5 10*3/uL (ref 0.1–1.0)
Monocytes Relative: 7.9 % (ref 3.0–12.0)
NEUTROS ABS: 3.1 10*3/uL (ref 1.4–7.7)
NEUTROS PCT: 45 % (ref 43.0–77.0)
PLATELETS: 224 10*3/uL (ref 150.0–400.0)
RBC: 5.09 Mil/uL (ref 4.22–5.81)
RDW: 13.4 % (ref 11.5–15.5)
WBC: 6.9 10*3/uL (ref 4.0–10.5)

## 2015-05-28 LAB — HEPATIC FUNCTION PANEL
ALBUMIN: 4.6 g/dL (ref 3.5–5.2)
ALK PHOS: 42 U/L (ref 39–117)
ALT: 45 U/L (ref 0–53)
AST: 29 U/L (ref 0–37)
Bilirubin, Direct: 0.1 mg/dL (ref 0.0–0.3)
TOTAL PROTEIN: 7.6 g/dL (ref 6.0–8.3)
Total Bilirubin: 0.7 mg/dL (ref 0.2–1.2)

## 2015-05-28 LAB — TSH: TSH: 1.13 u[IU]/mL (ref 0.35–4.50)

## 2015-05-28 LAB — MICROALBUMIN / CREATININE URINE RATIO
CREATININE, U: 180.2 mg/dL
MICROALB/CREAT RATIO: 0.4 mg/g (ref 0.0–30.0)
Microalb, Ur: <0.7 mg/dL (ref 0.0–1.9)

## 2015-05-28 LAB — LDL CHOLESTEROL, DIRECT: LDL DIRECT: 142 mg/dL

## 2015-05-28 LAB — HEMOGLOBIN A1C: HEMOGLOBIN A1C: 7.5 % — AB (ref 4.6–6.5)

## 2015-05-28 LAB — URIC ACID: Uric Acid, Serum: 8.6 mg/dL — ABNORMAL HIGH (ref 4.0–7.8)

## 2015-05-28 MED ORDER — METFORMIN HCL ER 500 MG PO TB24: ORAL_TABLET | ORAL | Status: DC

## 2015-05-28 NOTE — Patient Instructions (Signed)
Will notify you  of labs when available. And send stone for analysis  Will get urology  referral non emergently for fu . Glad you are doing better.  Plan fu depending on labs and how  You are doing or 6 months cpx.   Consider zetia ..for cholesterol.

## 2015-06-01 LAB — STONE ANALYSIS: Stone Weight KSTONE: 0.02 g

## 2015-06-05 ENCOUNTER — Telehealth: Payer: Self-pay | Admitting: Family Medicine

## 2015-06-05 NOTE — Telephone Encounter (Signed)
See result note Apology for delay :  was waiting on the stone analysis ( please send this to urology  Where I referred him)

## 2015-06-05 NOTE — Telephone Encounter (Signed)
Patient's wife notified by telephone.  See result note.

## 2015-06-05 NOTE — Telephone Encounter (Signed)
Patient's wife calling for results of lab work.  Please advise. Thanks!

## 2015-06-14 ENCOUNTER — Telehealth: Payer: Self-pay | Admitting: Internal Medicine

## 2015-06-14 MED ORDER — METFORMIN HCL ER 500 MG PO TB24: ORAL_TABLET | ORAL | Status: DC

## 2015-06-14 MED ORDER — LISINOPRIL 10 MG PO TABS: ORAL_TABLET | ORAL | Status: DC

## 2015-06-14 MED ORDER — SERTRALINE HCL 100 MG PO TABS: ORAL_TABLET | ORAL | Status: DC

## 2015-06-14 NOTE — Telephone Encounter (Addendum)
Pt request refill  lisinopril (PRINIVIL,ZESTRIL) 10 MG tablet metFORMIN (GLUCOPHAGE-XR) 500 MG 24 hr tablet sertraline (ZOLOFT) 100 MG tablet  90 day supply  Pt has new pharmacy and needs these sent to [facility]

## 2015-06-14 NOTE — Telephone Encounter (Signed)
Sent to the pharmacy by e-scribe. 

## 2015-08-06 ENCOUNTER — Ambulatory Visit (INDEPENDENT_AMBULATORY_CARE_PROVIDER_SITE_OTHER): Payer: Self-pay | Admitting: Family Medicine

## 2015-08-06 VITALS — BP 118/72 | HR 91 | Temp 97.2°F | Resp 18 | Ht 72.5 in | Wt 215.2 lb

## 2015-08-06 DIAGNOSIS — Z021 Encounter for pre-employment examination: Secondary | ICD-10-CM

## 2015-08-06 DIAGNOSIS — Z024 Encounter for examination for driving license: Secondary | ICD-10-CM

## 2015-08-06 NOTE — Patient Instructions (Signed)
     IF you received an x-ray today, you will receive an invoice from [facility]. Please contact [facility] at [phone] with questions or concerns regarding your invoice.   IF you received labwork today, you will receive an invoice from [facility]. Please contact [facility] at [phone] with questions or concerns regarding your invoice.   Our billing staff will not be able to assist you with questions regarding bills from these companies.  You will be contacted with the lab results as soon as they are available. The fastest way to get your results is to activate your My Chart account. Instructions are located on the last page of this paperwork. If you have not heard from us regarding the results in 2 weeks, please contact this office.      

## 2015-08-06 NOTE — Progress Notes (Signed)
Commercial Driver Medical Examination   Scott GullyCharles S Pineda is a 55 y.o. male who presents today for a commercial driver fitness determination physical exam. The patient reports problems - type 2 DM, wel controlled on metformin. hgba1c was 7.5 2 months prior.  HTN well controlled on medication. Mood d/o well controlled on zoloft.  Had cataract surgery >10 yrs ago. Was seen in the ER for kidney stones within the past several months but passed all 3 of them while in the ER, no recurrence since.  Follows closely with his PCP Dr. Fabian SharpPanosh whose records are available in this medical records system for my review.. The following portions of the patient's history were reviewed and updated as appropriate: allergies, current medications, past family history, past medical history, past social history, past surgical history and problem list. Review of Systems A comprehensive review of systems was negative.   Objective:    Vision:  Visual Acuity Screening   Right eye Left eye Both eyes  Without correction:     With correction: 20/25 20/25 20/25   Comments: Peripheral Vision: Right eye 85degrees. Left eye 85 degrees.   Applicant can recognize and distinguish among traffic control signals and devices showing standard red, green, and amber colors.     Monocular Vision?: No   Hearing:  Hearing Screening Comments: The patient was able to hear a forced whisper from 10 feet.      BP 118/72 mmHg  Pulse 91  Temp(Src) 97.2 F (36.2 C) (Oral)  Resp 18  Ht 6' 0.5" (1.842 m)  Wt 215 lb 3.2 oz (97.614 kg)  BMI 28.77 kg/m2  SpO2 96%  General Appearance:    Alert, cooperative, no distress, appears stated age  Head:    Normocephalic, without obvious abnormality, atraumatic  Eyes:    PERRL, conjunctiva/corneas clear, EOM's intact, fundi    benign, both eyes       Ears:    Normal TM's and external ear canals, both ears  Nose:   Nares normal, septum midline, mucosa normal, no drainage    or sinus tenderness   Throat:   Lips, mucosa, and tongue normal; teeth and gums normal  Neck:   Supple, symmetrical, trachea midline, no adenopathy;       thyroid:  No enlargement/tenderness/nodules; no carotid   bruit or JVD  Back:     Symmetric, no curvature, ROM normal, no CVA tenderness  Lungs:     Clear to auscultation bilaterally, respirations unlabored  Chest wall:    No tenderness or deformity  Heart:    Regular rate and rhythm, S1 and S2 normal, no murmur, rub   or gallop  Abdomen:     Soft, non-tender, bowel sounds active all four quadrants,    no masses, no organomegaly        Extremities:   Extremities normal, atraumatic, no cyanosis or edema  Pulses:   2+ and symmetric all extremities  Skin:   Skin color, texture, turgor normal, no rashes or lesions  Lymph nodes:   Cervical, supraclavicular, and axillary nodes normal  Neurologic:   CNII-XII intact. Normal strength, sensation and reflexes      throughout    Labs: Lab Results  Component Value Date   BILIRUBINUR negative 08/14/2008    SG 1.010, neg prot, neg blood, neg sug  Assessment:    Healthy male exam.  Meets standards, but periodic monitoring required due to HTN and type 2 DM.Marland Kitchen.  Driver qualified only for 1 year.    Plan:  Medical examiners certificate completed and printed. Return as needed.
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
## 2015-09-24 ENCOUNTER — Ambulatory Visit (INDEPENDENT_AMBULATORY_CARE_PROVIDER_SITE_OTHER): Payer: PRIVATE HEALTH INSURANCE | Admitting: Internal Medicine

## 2015-09-24 ENCOUNTER — Ambulatory Visit: Payer: PRIVATE HEALTH INSURANCE | Admitting: Internal Medicine

## 2015-09-24 ENCOUNTER — Encounter: Payer: Self-pay | Admitting: Internal Medicine

## 2015-09-24 VITALS — BP 140/80 | Temp 98.4°F | Wt 216.0 lb

## 2015-09-24 DIAGNOSIS — E119 Type 2 diabetes mellitus without complications: Secondary | ICD-10-CM | POA: Diagnosis not present

## 2015-09-24 DIAGNOSIS — I10 Essential (primary) hypertension: Secondary | ICD-10-CM | POA: Diagnosis not present

## 2015-09-24 LAB — POCT GLYCOSYLATED HEMOGLOBIN (HGB A1C): Hemoglobin A1C: 7.8

## 2015-09-24 NOTE — Patient Instructions (Signed)
Your bp readings  Are up for you goal below 140/90 nad preferred  130/85 or below for diabetes .   Increase lisinopril to 20 mg per day ( 2 10 mg)   After a month  Contact us my chart or call or OV  With BP readings and if ok can send in further refills for 20 mg tablets.

## 2015-10-30 ENCOUNTER — Other Ambulatory Visit: Payer: Self-pay | Admitting: Internal Medicine

## 2015-10-30 NOTE — Telephone Encounter (Signed)
Increase lisinopril to 20 mg per day ( 2 10 mg)   After a month  Contact us my chart or call or OV  With BP readings and if ok can send in further refills for 20 mg tablets.   Diane sent me a text message that stated Scott Pineda is doing well on 20 mg but did not give me exact readings.  Ok to refill?

## 2015-10-31 NOTE — Telephone Encounter (Signed)
Sent to the pharmacy by e-scribe. 

## 2015-10-31 NOTE — Telephone Encounter (Signed)
Ok to  rx  lisinopril 20 mg per day  90 days refill x 1

## 2015-11-08 MED ORDER — LISINOPRIL 20 MG PO TABS: 20.0000 mg | ORAL_TABLET | Freq: Every day | ORAL | 1 refills | Status: DC

## 2015-11-08 NOTE — Telephone Encounter (Signed)
Diane informed me that insurance will not cover two 10 mg tabs.  Will change rx to 20 mg tabs.

## 2015-11-08 NOTE — Addendum Note (Signed)
Addended by: Raj JanusADKINS, Vila Dory T on: 11/08/2015 11:35 AM   Modules accepted: Orders

## 2015-11-16 ENCOUNTER — Telehealth: Payer: Self-pay | Admitting: Internal Medicine

## 2015-11-16 NOTE — Telephone Encounter (Signed)
Pt is having issues with his blood sugars and is spiking when it should not.  Pts wife would like to have a call.

## 2015-11-19 NOTE — Telephone Encounter (Signed)
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 His blood sugars having been staying around 200's and has spiked a few times to 300's. Mrs. Yetta BarreJones said she made an appt for him for next Monday to see Dr.Panosh can not come in before that. She would like to know if there is anything pt can do prior to appt for blood sugars. Told her I will send message to Dr. Fabian SharpPanosh and get back to her tomorrow. Mrs Yetta BarreJones verbalized understanding.

## 2015-11-19 NOTE — Telephone Encounter (Signed)
Dr.Panosh please see message and advise.

## 2015-11-19 NOTE — Telephone Encounter (Signed)
We would have to add another medicine   And would need to talk with him about this   Can send in  januvia  100 mg 1 po  Qd disp 14 to try  Until then   May not be strong enough but should be  Safe

## 2015-11-20 MED ORDER — SITAGLIPTIN PHOSPHATE 100 MG PO TABS: 100.0000 mg | ORAL_TABLET | Freq: Every day | ORAL | 0 refills | Status: DC

## 2015-11-20 NOTE — Telephone Encounter (Signed)
Diane (wife) notified [facility] [address]  Instructed to keep appt on 11/26/15.

## 2015-11-26 ENCOUNTER — Ambulatory Visit (INDEPENDENT_AMBULATORY_CARE_PROVIDER_SITE_OTHER): Payer: PRIVATE HEALTH INSURANCE | Admitting: Internal Medicine

## 2015-11-26 ENCOUNTER — Encounter: Payer: Self-pay | Admitting: Internal Medicine

## 2015-11-26 VITALS — BP 134/86 | Temp 97.8°F | Wt 217.4 lb

## 2015-11-26 DIAGNOSIS — Z79899 Other long term (current) drug therapy: Secondary | ICD-10-CM | POA: Diagnosis not present

## 2015-11-26 DIAGNOSIS — IMO0001 Reserved for inherently not codable concepts without codable children: Secondary | ICD-10-CM

## 2015-11-26 DIAGNOSIS — E1165 Type 2 diabetes mellitus with hyperglycemia: Secondary | ICD-10-CM

## 2015-11-26 DIAGNOSIS — I10 Essential (primary) hypertension: Secondary | ICD-10-CM

## 2015-11-26 DIAGNOSIS — Z23 Encounter for immunization: Secondary | ICD-10-CM

## 2015-11-26 MED ORDER — SERTRALINE HCL 100 MG PO TABS: ORAL_TABLET | ORAL | 1 refills | Status: DC

## 2015-11-26 MED ORDER — SITAGLIPTIN PHOSPHATE 100 MG PO TABS: 100.0000 mg | ORAL_TABLET | Freq: Every day | ORAL | 11 refills | Status: DC

## 2015-11-26 NOTE — Patient Instructions (Signed)
It is good that your blood sugar is coming down on the Januvia. Stay on the metformin and Januvia at this time let us know if blood sugar is rising again into the 2 and 300s. Consideration of other medications such as Jardiance  or Trulicity. One is a weekly injectable the other is a pill that is associated with increased urination.  For now   ROV in 3 months and we can check a1c at that visit .

## 2015-11-26 NOTE — Progress Notes (Signed)
Pre visit review using our clinic review tool, if applicable. No additional management support is needed unless otherwise documented below in the visit note. 
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
## 2015-12-06 ENCOUNTER — Telehealth: Payer: Self-pay | Admitting: Internal Medicine

## 2015-12-06 NOTE — Telephone Encounter (Signed)
TELEPHONE ADVICE RECORD [facility]  Patient Name: Scott Pineda  DOB: [birth year]    Initial Comment Caller states husband cannot get bs below 170.   Nurse Assessment  Nurse: Odis LusterBowers, RN, Bjorn Loserhonda Date/Time (Eastern Time): 12/06/2015 10:31:36 AM  Confirm and document reason for call. If symptomatic, describe symptoms. You must click the next button to save text entered. ---Caller states husband cannot get bs below 170. Caller reports that pt is not having any symptoms. 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 Has been creeping back up this week. He is taking metformin (and a new med).  Has the patient traveled out of the country within the last 30 days? ---Not Applicable  Does the patient have any new or worsening symptoms? ---Yes  Will a triage be completed? ---No  Select reason for no triage. ---Other  Please document clinical information provided and list any resource used. ---Attempted to reach patient for triage but is currently unavailable. Advised caller to have the patient call for further triage and advice. Caller voiced understanding.     Guidelines    Guideline Title Affirmed Question Affirmed Notes       Final Disposition User   Clinical Call Odis LusterBowers, RN, Bjorn Loserhonda

## 2015-12-07 NOTE — Telephone Encounter (Signed)
LMTCB

## 2015-12-11 NOTE — Telephone Encounter (Signed)
Spoke with pt and he states that his blood sugar has been closer to normal, running about 140 with some fluctuation. Pt states that he has changed his diet some and has noticed improvements. Pt is aware that he needs follow up in January. Nothing further needed.

## 2016-04-28 ENCOUNTER — Ambulatory Visit: Payer: PRIVATE HEALTH INSURANCE | Admitting: Internal Medicine

## 2016-05-02 ENCOUNTER — Ambulatory Visit (INDEPENDENT_AMBULATORY_CARE_PROVIDER_SITE_OTHER): Payer: PRIVATE HEALTH INSURANCE | Admitting: Internal Medicine

## 2016-05-02 ENCOUNTER — Encounter: Payer: Self-pay | Admitting: Internal Medicine

## 2016-05-02 VITALS — BP 120/90 | HR 91 | Temp 97.8°F | Ht 72.0 in | Wt 217.6 lb

## 2016-05-02 DIAGNOSIS — I10 Essential (primary) hypertension: Secondary | ICD-10-CM | POA: Diagnosis not present

## 2016-05-02 DIAGNOSIS — E785 Hyperlipidemia, unspecified: Secondary | ICD-10-CM

## 2016-05-02 DIAGNOSIS — IMO0001 Reserved for inherently not codable concepts without codable children: Secondary | ICD-10-CM

## 2016-05-02 DIAGNOSIS — Z79899 Other long term (current) drug therapy: Secondary | ICD-10-CM | POA: Diagnosis not present

## 2016-05-02 DIAGNOSIS — Z789 Other specified health status: Secondary | ICD-10-CM | POA: Diagnosis not present

## 2016-05-02 DIAGNOSIS — E1165 Type 2 diabetes mellitus with hyperglycemia: Secondary | ICD-10-CM | POA: Diagnosis not present

## 2016-05-02 DIAGNOSIS — M7989 Other specified soft tissue disorders: Secondary | ICD-10-CM | POA: Diagnosis not present

## 2016-05-02 LAB — CBC WITH DIFFERENTIAL/PLATELET
BASOS ABS: 0 {cells}/uL (ref 0–200)
BASOS PCT: 0 %
EOS ABS: 140 {cells}/uL (ref 15–500)
Eosinophils Relative: 2 %
HCT: 45.9 % (ref 38.5–50.0)
HEMOGLOBIN: 15.5 g/dL (ref 13.2–17.1)
LYMPHS ABS: 2800 {cells}/uL (ref 850–3900)
Lymphocytes Relative: 40 %
MCH: 30.3 pg (ref 27.0–33.0)
MCHC: 33.8 g/dL (ref 32.0–36.0)
MCV: 89.6 fL (ref 80.0–100.0)
MPV: 11.3 fL (ref 7.5–12.5)
Monocytes Absolute: 560 cells/uL (ref 200–950)
Monocytes Relative: 8 %
NEUTROS ABS: 3500 {cells}/uL (ref 1500–7800)
Neutrophils Relative %: 50 %
PLATELETS: 230 10*3/uL (ref 140–400)
RBC: 5.12 MIL/uL (ref 4.20–5.80)
RDW: 13.6 % (ref 11.0–15.0)
WBC: 7 10*3/uL (ref 3.8–10.8)

## 2016-05-02 LAB — BASIC METABOLIC PANEL
BUN: 13 mg/dL (ref 7–25)
CHLORIDE: 101 mmol/L (ref 98–110)
CO2: 24 mmol/L (ref 20–31)
Calcium: 9.5 mg/dL (ref 8.6–10.3)
Creat: 0.89 mg/dL (ref 0.70–1.33)
Glucose, Bld: 132 mg/dL — ABNORMAL HIGH (ref 65–99)
POTASSIUM: 4.1 mmol/L (ref 3.5–5.3)
SODIUM: 136 mmol/L (ref 135–146)

## 2016-05-02 LAB — HEPATIC FUNCTION PANEL
ALT: 34 U/L (ref 9–46)
AST: 28 U/L (ref 10–35)
Albumin: 4.6 g/dL (ref 3.6–5.1)
Alkaline Phosphatase: 43 U/L (ref 40–115)
BILIRUBIN DIRECT: 0.1 mg/dL (ref <=0.2)
BILIRUBIN INDIRECT: 0.3 mg/dL (ref 0.2–1.2)
BILIRUBIN TOTAL: 0.4 mg/dL (ref 0.2–1.2)
Total Protein: 7.8 g/dL (ref 6.1–8.1)

## 2016-05-02 LAB — LIPID PANEL
CHOL/HDL RATIO: 7.6 ratio — AB (ref <5.0)
Cholesterol: 213 mg/dL — ABNORMAL HIGH (ref <200)
HDL: 28 mg/dL — ABNORMAL LOW (ref >40)
LDL CALC: 106 mg/dL — AB (ref <100)
TRIGLYCERIDES: 397 mg/dL — AB (ref <150)
VLDL: 79 mg/dL — AB (ref <30)

## 2016-05-02 LAB — POCT GLYCOSYLATED HEMOGLOBIN (HGB A1C): HEMOGLOBIN A1C: 7

## 2016-05-02 MED ORDER — LISINOPRIL 20 MG PO TABS: 20.0000 mg | ORAL_TABLET | Freq: Every day | ORAL | 1 refills | Status: DC

## 2016-05-02 MED ORDER — CEPHALEXIN 500 MG PO CAPS: 500.0000 mg | ORAL_CAPSULE | Freq: Three times a day (TID) | ORAL | 0 refills | Status: AC

## 2016-05-02 MED ORDER — SERTRALINE HCL 100 MG PO TABS: ORAL_TABLET | ORAL | 1 refills | Status: AC

## 2016-05-02 MED ORDER — DAPAGLIFLOZIN PROPANEDIOL 10 MG PO TABS: 10.0000 mg | ORAL_TABLET | Freq: Every day | ORAL | 3 refills | Status: DC

## 2016-05-02 MED ORDER — METFORMIN HCL ER 500 MG PO TB24: ORAL_TABLET | ORAL | 1 refills | Status: DC

## 2016-05-02 NOTE — Patient Instructions (Addendum)
Will notify you  of labs when available.   Trial of  New med  Class Marcelline DeistFarxiga   Once a day  Causes .   Increase urine .   Consider other med  For  Lipids.   [facility] .   Plan rov  depending on  Lab results . Antibiotic for hand infection  Colon cancer  screening  Look into  cologuard .
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
## 2016-05-03 LAB — HEMOGLOBIN A1C
HEMOGLOBIN A1C: 6.9 % — AB (ref <5.7)
Mean Plasma Glucose: 151 mg/dL

## 2016-05-13 ENCOUNTER — Telehealth: Payer: Self-pay | Admitting: Internal Medicine

## 2016-05-13 MED ORDER — SITAGLIPTIN PHOSPHATE 100 MG PO TABS
100.0000 mg | ORAL_TABLET | Freq: Every day | ORAL | 7 refills | Status: DC
Start: 2016-05-13 — End: 2022-10-21

## 2016-05-13 NOTE — Telephone Encounter (Signed)
Sent to new pharmacy

## 2016-05-13 NOTE — Telephone Encounter (Signed)
Pt request refill of the following: ° ° °Phamacy: °

## 2016-05-13 NOTE — Telephone Encounter (Signed)
° ° ° ° °  Pt request refill of the following:  sitaGLIPtin (JANUVIA) 100 MG tablet   Phamacy:  [facility] [address]

## 2016-07-28 ENCOUNTER — Ambulatory Visit (INDEPENDENT_AMBULATORY_CARE_PROVIDER_SITE_OTHER): Payer: Self-pay | Admitting: Emergency Medicine

## 2016-07-28 ENCOUNTER — Encounter: Payer: Self-pay | Admitting: Emergency Medicine

## 2016-07-28 VITALS — BP 128/80 | HR 96 | Temp 98.0°F | Resp 16 | Ht 71.77 in | Wt 216.0 lb

## 2016-07-28 DIAGNOSIS — Z024 Encounter for examination for driving license: Secondary | ICD-10-CM

## 2016-07-28 NOTE — Progress Notes (Signed)
Commercial Driver Medical Examination   Scott GullyCharles S Pineda is a 56 y.o. male who presents today for a commercial driver fitness determination physical exam. The patient reports no problems. The following portions of the patient's history were reviewed and updated as appropriate: allergies, current medications, past family history, past medical history, past social history, past surgical history and problem list. Review of Systems A comprehensive review of systems was negative.   Objective:    Vision:  Visual Acuity Screening   Right eye Left eye Both eyes  Without correction:     With correction: 20/20 20/20 20/20   Comments: 85 degrees peripheral vision   Hearing Screening Comments: 10 feet whisper test      Applicant can recognize and distinguish among traffic control signals and devices showing standard red, green, and amber colors.  Applicant meets visual acuity requirement only when wearing corrective lenses.  Monocular Vision?: No   BP 128/80 (BP Location: Right Arm, Patient Position: Sitting, Cuff Size: Large)   Pulse 96   Temp 98 F (36.7 C) (Oral)   Resp 16   Ht 5' 11.77" (1.823 m)   Wt 216 lb (98 kg)   SpO2 97%   BMI 29.48 kg/m   General Appearance:    Alert, cooperative, no distress, appears stated age  Head:    Normocephalic, without obvious abnormality, atraumatic  Eyes:    PERRL, conjunctiva/corneas clear, EOM's intact, both eyes       Ears:    Normal TM's and external ear canals, both ears  Nose:   Nares normal, septum midline, mucosa normal, no drainage    or sinus tenderness  Throat:   Lips, mucosa, and tongue normal; teeth and gums normal  Neck:   Supple, symmetrical, trachea midline, no adenopathy;       thyroid:  No enlargement/tenderness/nodules; no carotid   bruit or JVD  Back:     Symmetric, no curvature, ROM normal, no CVA tenderness  Lungs:     Clear to auscultation bilaterally, respirations unlabored  Chest wall:    No tenderness or deformity   Heart:    Regular rate and rhythm, S1 and S2 normal, no murmur, rub   or gallop  Abdomen:     Soft, non-tender, bowel sounds active all four quadrants,    no masses, no organomegaly        Extremities:   Extremities normal, atraumatic, no cyanosis or edema  Pulses:   2+ and symmetric all extremities  Skin:   Skin color, texture, turgor normal, no rashes or lesions  Lymph nodes:   Cervical, supraclavicular, and axillary nodes normal  Neurologic:   CNII-XII intact. Normal strength, sensation and reflexes      throughout    Labs: Lab Results  Component Value Date   BILIRUBINUR negative 08/14/2008      Assessment:    Healthy male exam.  Meets standards, but periodic monitoring required due to HTN and DMT2.  Driver qualified only for 1 year.    Plan:    Medical examiners certificate completed and printed. Return as needed.

## 2016-07-28 NOTE — Patient Instructions (Signed)
     IF you received an x-ray today, you will receive an invoice from [facility]. Please contact [facility] at [phone] with questions or concerns regarding your invoice.   IF you received labwork today, you will receive an invoice from [facility]. Please contact [facility] at [phone] with questions or concerns regarding your invoice.   Our billing staff will not be able to assist you with questions regarding bills from these companies.  You will be contacted with the lab results as soon as they are available. The fastest way to get your results is to activate your My Chart account. Instructions are located on the last page of this paperwork. If you have not heard from us regarding the results in 2 weeks, please contact this office.     

## 2016-10-20 ENCOUNTER — Emergency Department (HOSPITAL_COMMUNITY)
Admission: EM | Admit: 2016-10-20 | Discharge: 2016-10-20 | Disposition: A | Discharge status: Home / Self Care | Payer: PRIVATE HEALTH INSURANCE | Attending: Emergency Medicine | Admitting: Emergency Medicine

## 2016-10-20 ENCOUNTER — Encounter (HOSPITAL_COMMUNITY): Payer: Self-pay | Admitting: *Deleted

## 2016-10-20 DIAGNOSIS — E119 Type 2 diabetes mellitus without complications: Secondary | ICD-10-CM | POA: Insufficient documentation

## 2016-10-20 DIAGNOSIS — W57XXXA Bitten or stung by nonvenomous insect and other nonvenomous arthropods, initial encounter: Secondary | ICD-10-CM | POA: Insufficient documentation

## 2016-10-20 DIAGNOSIS — I10 Essential (primary) hypertension: Secondary | ICD-10-CM | POA: Diagnosis not present

## 2016-10-20 DIAGNOSIS — R5383 Other fatigue: Secondary | ICD-10-CM | POA: Diagnosis not present

## 2016-10-20 DIAGNOSIS — R21 Rash and other nonspecific skin eruption: Secondary | ICD-10-CM | POA: Insufficient documentation

## 2016-10-20 DIAGNOSIS — Z7984 Long term (current) use of oral hypoglycemic drugs: Secondary | ICD-10-CM | POA: Diagnosis not present

## 2016-10-20 DIAGNOSIS — J45909 Unspecified asthma, uncomplicated: Secondary | ICD-10-CM | POA: Diagnosis not present

## 2016-10-20 MED ORDER — DOXYCYCLINE HYCLATE 100 MG PO CAPS: 100.0000 mg | ORAL_CAPSULE | Freq: Two times a day (BID) | ORAL | 0 refills | Status: AC

## 2016-10-20 NOTE — ED Notes (Signed)
Pt reports nausea, chills, generalized weakness, HA, and subjective fever x 5 days. Pt reports being bitten by an insect 5 days ago on his RLQ but is unsure if that correlates with his current symptoms.

## 2016-10-20 NOTE — ED Notes (Signed)
Dr. Hyacinth MeekerMiller to see pt before d/c.

## 2016-10-20 NOTE — ED Provider Notes (Signed)
I saw and evaluated the patient, reviewed the resident's note and I agree with the findings and plan.  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 He has developed a rash which has gradually improved but he is left with some residual rash in the right lower abdomen. He denies any coughing shortness of breath chest pain and has no abdominal pain or tenderness over the rash site.   Pertinent Exam findings: on exam the patient does have a rash in the right lower anterior abdominal wall. This is not vesicular pustular petechial or purpuric, it does appear to have some central open wound with minimal surrounding redness and is nontender. Lungs are clear, heart is regular, no tachycardia, no edema, well-appearing otherwise.  Doxy, f/u Pt in agreement Spouse also in agreement   EKG Interpretation  Date/Time:  Monday October 20 2016 19:05:26 EDT Ventricular Rate:  97 PR Interval:    QRS Duration: 94 QT Interval:  348 QTC Calculation: 442 R Axis:   -23 Text Interpretation:  Sinus rhythm Borderline left axis deviation since last tracing no significant change Confirmed by Eber HongMiller, Melia Hopes ([id number]) on 10/20/2016 7:35:11 PM Also confirmed by Eber HongMiller, Mehtab Dolberry ([id number]), editor Elita QuickWatlington, Beverly ([id number])  on 10/21/2016 8:53:27 AM       Final diagnoses:  Insect bite, initial encounter  Rash      Eber HongMiller, Brin Ruggerio, MD 10/21/16 [phone]

## 2016-10-20 NOTE — ED Provider Notes (Signed)
[facility]-EMERGENCY DEPT Provider Note   CSN: [id number] Arrival date & time: 10/20/16  1536     History   Chief Complaint Chief Complaint  Patient presents with  . Insect Bite    HPI Scott Pineda is a 56 y.o. male.  This is a 56 year old male with PMH of HTN, T2 DM, HLD who presents with insect right which he first noticed 3 days prior.  Patient states he found a tick on himself 3 weeks prior.  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 He denies any history of STI's, denies other rashes, arthralgias, pain with urination, penile discharge, penile pain or swelling.  Denies the rash being itchy or painful to touch.  He states it was actually worse a few days prior and is now resolved.  He denies any target-like lesion pattern.  Endorses subjective fevers nightly however no measured fever at home.  Denies any night sweats or chills or change in appetite.  Denies any nausea or vomiting, dyspnea, chest pain, change in vision, slurred speech, facial droop, tinnitus, trouble swallowing. He has not seen a doctor for this yet.  He is establishing care with a new PCP in 7 days from now.      Past Medical History:  Diagnosis Date  . ADVERSE REACTION TO MEDICATION 10/09/2008   Qualifier: Diagnosis of  By: Fabian Sharp MD, Neta Mends   . Allergic rhinitis   . Asthma   . Depression   . GERD (gastroesophageal reflux disease)   . Gout   . Hyperlipidemia   . Hypertension   . Salivary stone   . Trauma    hx of falling trauma and back pain with side effects of medication, parkisonian now resolved    Patient Active Problem List   Diagnosis Date Noted  . Statin intolerance 05/27/2015  . Uncontrolled type 2 diabetes mellitus without complication, without long-term current use of insulin (HCC) 04/20/2015  . Medication management 05/23/2013  . Foot pain, right 05/23/2013  . Type 2 diabetes mellitus, uncontrolled (HCC) 05/17/2012  . Diabetes (HCC) 05/17/2012  . GANGLION CYST,  WRIST, RIGHT 04/02/2009  . DIABETES-TYPE 2 10/09/2008  . SARCOIDOSIS 08/12/2006  . HYPERLIPIDEMIA 08/12/2006  . GOUT 08/12/2006  . DEPRESSION 08/12/2006  . Essential hypertension 08/12/2006  . ALLERGIC RHINITIS 08/12/2006  . ASTHMA 08/12/2006  . GERD 08/12/2006    Past Surgical History:  Procedure Laterality Date  . APPENDECTOMY    . CATARACT EXTRACTION     left 2009 and right 2006  . CHOLECYSTECTOMY    . HERNIA REPAIR    . salivary stone extracted    . TONSILLECTOMY         Home Medications    Prior to Admission medications   Medication Sig Start Date End Date Taking? Authorizing Provider  APPLE CIDER VINEGAR PO Take by mouth.     Yes [provider]  lisinopril (PRINIVIL,ZESTRIL) 20 MG tablet Take 1 tablet (20 mg total) by mouth daily. 05/02/16  Yes Panosh, Neta Mends, MD  metFORMIN (GLUCOPHAGE-XR) 500 MG 24 hr tablet TAKE 4 TABLETS PER DAY AS DIRECTED Patient taking differently: Take 1,000 mg by mouth 2 (two) times daily.  05/02/16  Yes Panosh, Neta Mends, MD  Multiple Vitamin (MULTIVITAMIN) capsule Take 1 capsule by mouth daily.   Yes [provider]  sertraline (ZOLOFT) 100 MG tablet TAKE 1 TABLET (100 MG TOTAL) BY MOUTH DAILY. 05/02/16  Yes Panosh, Neta Mends, MD  sitaGLIPtin (JANUVIA) 100 MG  tablet Take 1 tablet (100 mg total) by mouth daily. 05/13/16  Yes Panosh, Neta MendsWanda K, MD  dapagliflozin propanediol (FARXIGA) 10 MG TABS tablet Take 10 mg by mouth daily. Patient not taking: Reported on 07/28/2016 05/02/16   Panosh, Neta MendsWanda K, MD  doxycycline (VIBRAMYCIN) 100 MG capsule Take 1 capsule (100 mg total) by mouth 2 (two) times daily. 10/20/16 10/30/16  Shaune PollackBriggs, Amisadai Woodford, MD    Family History Family History  Problem Relation Age of Onset  . Adopted: Yes    Social History Social History  Substance Use Topics  . Smoking status: Never Smoker  . Smokeless tobacco: Never Used  . Alcohol use No     Allergies   Ibuprofen and Statins   Review of Systems Review of  Systems  Constitutional: Positive for fatigue. Negative for activity change, appetite change, chills, diaphoresis, fever and unexpected weight change.  HENT: Negative for ear pain and sore throat.   Eyes: Negative for pain and visual disturbance.  Respiratory: Negative for cough and shortness of breath.   Cardiovascular: Negative for chest pain and palpitations.  Gastrointestinal: Negative for abdominal pain, blood in stool, constipation, diarrhea, nausea and vomiting.  Genitourinary: Negative for difficulty urinating, discharge, dysuria, flank pain, genital sores, hematuria, penile pain, penile swelling, scrotal swelling and testicular pain.  Musculoskeletal: Negative for arthralgias, back pain, gait problem, joint swelling, myalgias, neck pain and neck stiffness.  Skin: Positive for rash and wound. Negative for color change.  Allergic/Immunologic: Negative for immunocompromised state.  Neurological: Negative for seizures and syncope.  All other systems reviewed and are negative.    Physical Exam Updated Vital Signs BP 126/88   Pulse 94   Temp 98.8 F (37.1 C) (Oral)   Resp 18   SpO2 97%   Physical Exam  Constitutional: He is oriented to person, place, and time. He appears well-developed and well-nourished. No distress.  HENT:  Head: Normocephalic and atraumatic.  Eyes: Pupils are equal, round, and reactive to light. Conjunctivae and EOM are normal.  Neck: Normal range of motion and full passive range of motion without pain. Neck supple. No neck rigidity. No Brudzinski's sign and no Kernig's sign noted.  Cardiovascular: Normal rate, regular rhythm, normal heart sounds and intact distal pulses.   No murmur heard. Pulmonary/Chest: Effort normal and breath sounds normal. No respiratory distress.  Abdominal: Soft. There is no tenderness.  Genitourinary: Testes normal and penis normal. Right testis shows no swelling and no tenderness. Left testis shows no swelling and no tenderness.  Uncircumcised.  Musculoskeletal: Normal range of motion. He exhibits no edema.       Left elbow: Normal.       Right hip: Normal. He exhibits no tenderness.       Left hip: Normal. He exhibits no tenderness.       Right knee: Normal. He exhibits no swelling and no effusion.       Left knee: Normal. He exhibits no swelling and no effusion.  Neurological: He is alert and oriented to person, place, and time. He has normal strength. He displays no tremor. No cranial nerve deficit or sensory deficit. Coordination and gait normal.  Skin: Skin is warm and dry. Capillary refill takes less than 2 seconds. Lesion and rash noted. Rash is nodular. He is not diaphoretic. There is erythema.  In RLQ of abd, Punctate scab with surrounding pink, erythematous tissue suggestive of recent skin injury.  No other rashes or skin changes noted on exam.  Psychiatric: He has a normal  mood and affect.  Nursing note and vitals reviewed.  ED Treatments / Results  Labs (all labs ordered are listed, but only abnormal results are displayed) Labs Reviewed - No data to display  EKG  EKG Interpretation None       Radiology No results found.  Procedures Procedures (including critical care time)  Medications Ordered in ED Medications - No data to display   Initial Impression / Assessment and Plan / ED Course  I have reviewed the triage vital signs and the nursing notes.  Pertinent labs & imaging results that were available during my care of the patient were reviewed by me and considered in my medical decision making (see chart for details).     This is a 56 year old male with PMH of HTN, T2 DM, HLD who presents with insect right which he first noticed 3 days prior.  Patient states he found a tick on himself 3 weeks prior.  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  Patient is well-appearing, exam as noted above.  No facial droop noted suggestive of Bell's palsy. ED EKG shows no  AV block.  Patient denies any arthralgias. Lower suspicion for Lyme disease or other tickborne illness at this time.  Wound appears well-healing with minimal tenderness to palpation, however even patient finding tick 3 weeks prior, subjective fevers at home, we will proceed with prophylactic doxycycline for 10 day therapy with follow-up with new PCP in 7 days.  Patient given return precautions and differential diagnosis discussed. All questions answered.  Final Clinical Impressions(s) / ED Diagnoses   Final diagnoses:  Insect bite, initial encounter  Rash    New Prescriptions New Prescriptions   DOXYCYCLINE (VIBRAMYCIN) 100 MG CAPSULE    Take 1 capsule (100 mg total) by mouth 2 (two) times daily.     Shaune Pollack, MD 10/20/16 Eldred Manges    Eber Hong, MD 10/21/16 [phone]

## 2016-10-20 NOTE — ED Triage Notes (Signed)
Pt c/o generalized weakness onset since he was bitten by an insect Aug 28th on RLQ, pt has marks to R lower abd with scab from scratching, pt c/o itching, pt afebrile in triage, A&O x4

## 2016-10-22 ENCOUNTER — Other Ambulatory Visit: Payer: Self-pay | Admitting: Internal Medicine

## 2022-10-21 ENCOUNTER — Emergency Department (HOSPITAL_COMMUNITY): Payer: 59

## 2022-10-21 ENCOUNTER — Encounter (HOSPITAL_COMMUNITY): Payer: Self-pay

## 2022-10-21 ENCOUNTER — Inpatient Hospital Stay (HOSPITAL_COMMUNITY)
Admission: EM | Admit: 2022-10-21 | Discharge: 2022-10-26 | DRG: 286 | Disposition: A | Discharge status: Home Health | Payer: 59 | Attending: Internal Medicine | Admitting: Internal Medicine

## 2022-10-21 ENCOUNTER — Other Ambulatory Visit: Payer: Self-pay

## 2022-10-21 DIAGNOSIS — E119 Type 2 diabetes mellitus without complications: Secondary | ICD-10-CM | POA: Diagnosis not present

## 2022-10-21 DIAGNOSIS — E785 Hyperlipidemia, unspecified: Secondary | ICD-10-CM | POA: Diagnosis not present

## 2022-10-21 DIAGNOSIS — W06XXXA Fall from bed, initial encounter: Secondary | ICD-10-CM | POA: Diagnosis not present

## 2022-10-21 DIAGNOSIS — G928 Other toxic encephalopathy: Secondary | ICD-10-CM | POA: Diagnosis not present

## 2022-10-21 DIAGNOSIS — E781 Pure hyperglyceridemia: Secondary | ICD-10-CM | POA: Diagnosis present

## 2022-10-21 DIAGNOSIS — E1165 Type 2 diabetes mellitus with hyperglycemia: Secondary | ICD-10-CM | POA: Diagnosis present

## 2022-10-21 DIAGNOSIS — F32A Depression, unspecified: Secondary | ICD-10-CM | POA: Diagnosis present

## 2022-10-21 DIAGNOSIS — Z886 Allergy status to analgesic agent status: Secondary | ICD-10-CM | POA: Diagnosis not present

## 2022-10-21 DIAGNOSIS — E1169 Type 2 diabetes mellitus with other specified complication: Secondary | ICD-10-CM | POA: Diagnosis present

## 2022-10-21 DIAGNOSIS — R42 Dizziness and giddiness: Secondary | ICD-10-CM | POA: Diagnosis not present

## 2022-10-21 DIAGNOSIS — J45909 Unspecified asthma, uncomplicated: Secondary | ICD-10-CM | POA: Diagnosis present

## 2022-10-21 DIAGNOSIS — E876 Hypokalemia: Secondary | ICD-10-CM | POA: Diagnosis present

## 2022-10-21 DIAGNOSIS — R079 Chest pain, unspecified: Secondary | ICD-10-CM | POA: Diagnosis present

## 2022-10-21 DIAGNOSIS — I700 Atherosclerosis of aorta: Secondary | ICD-10-CM | POA: Diagnosis present

## 2022-10-21 DIAGNOSIS — G2581 Restless legs syndrome: Secondary | ICD-10-CM | POA: Diagnosis present

## 2022-10-21 DIAGNOSIS — R4182 Altered mental status, unspecified: Secondary | ICD-10-CM | POA: Diagnosis not present

## 2022-10-21 DIAGNOSIS — Z7984 Long term (current) use of oral hypoglycemic drugs: Secondary | ICD-10-CM

## 2022-10-21 DIAGNOSIS — Z888 Allergy status to other drugs, medicaments and biological substances status: Secondary | ICD-10-CM | POA: Diagnosis not present

## 2022-10-21 DIAGNOSIS — I25110 Atherosclerotic heart disease of native coronary artery with unstable angina pectoris: Principal | ICD-10-CM | POA: Diagnosis present

## 2022-10-21 DIAGNOSIS — I200 Unstable angina: Secondary | ICD-10-CM | POA: Diagnosis present

## 2022-10-21 DIAGNOSIS — Z79899 Other long term (current) drug therapy: Secondary | ICD-10-CM

## 2022-10-21 DIAGNOSIS — Z794 Long term (current) use of insulin: Secondary | ICD-10-CM

## 2022-10-21 DIAGNOSIS — R569 Unspecified convulsions: Secondary | ICD-10-CM | POA: Diagnosis not present

## 2022-10-21 DIAGNOSIS — K219 Gastro-esophageal reflux disease without esophagitis: Secondary | ICD-10-CM | POA: Diagnosis present

## 2022-10-21 DIAGNOSIS — G9341 Metabolic encephalopathy: Secondary | ICD-10-CM | POA: Diagnosis not present

## 2022-10-21 DIAGNOSIS — Y92230 Patient room in hospital as the place of occurrence of the external cause: Secondary | ICD-10-CM | POA: Diagnosis not present

## 2022-10-21 DIAGNOSIS — I152 Hypertension secondary to endocrine disorders: Secondary | ICD-10-CM | POA: Diagnosis present

## 2022-10-21 DIAGNOSIS — R739 Hyperglycemia, unspecified: Principal | ICD-10-CM

## 2022-10-21 DIAGNOSIS — E1159 Type 2 diabetes mellitus with other circulatory complications: Secondary | ICD-10-CM | POA: Diagnosis present

## 2022-10-21 DIAGNOSIS — Z789 Other specified health status: Secondary | ICD-10-CM | POA: Diagnosis present

## 2022-10-21 LAB — TROPONIN I (HIGH SENSITIVITY)
Troponin I (High Sensitivity): 7 ng/L (ref <18)
Troponin I (High Sensitivity): 8 ng/L (ref <18)

## 2022-10-21 LAB — CBG MONITORING, ED
Glucose-Capillary: 523 mg/dL (ref 70–99)
Glucose-Capillary: 386 mg/dL — ABNORMAL HIGH (ref 70–99)
Glucose-Capillary: 353 mg/dL — ABNORMAL HIGH (ref 70–99)

## 2022-10-21 LAB — BLOOD GAS, VENOUS
Acid-Base Excess: 2.6 mmol/L — ABNORMAL HIGH (ref 0.0–2.0)
Bicarbonate: 27.2 mmol/L (ref 20.0–28.0)
O2 Saturation: 80.2 %
Patient temperature: 37
pCO2, Ven: 41 mmHg — ABNORMAL LOW (ref 44–60)
pH, Ven: 7.43 (ref 7.25–7.43)
pO2, Ven: 45 mmHg (ref 32–45)

## 2022-10-21 LAB — CBC
HCT: 45.5 % (ref 39.0–52.0)
Hemoglobin: 16.1 g/dL (ref 13.0–17.0)
MCH: 31.1 pg (ref 26.0–34.0)
MCHC: 35.4 g/dL (ref 30.0–36.0)
MCV: 88 fL (ref 80.0–100.0)
Platelets: 221 10*3/uL (ref 150–400)
RBC: 5.17 MIL/uL (ref 4.22–5.81)
RDW: 12.8 % (ref 11.5–15.5)
WBC: 7.7 10*3/uL (ref 4.0–10.5)
nRBC: 0 % (ref 0.0–0.2)

## 2022-10-21 LAB — BASIC METABOLIC PANEL
Anion gap: 15 (ref 5–15)
BUN: 9 mg/dL (ref 8–23)
CO2: 20 mmol/L — ABNORMAL LOW (ref 22–32)
Calcium: 9.3 mg/dL (ref 8.9–10.3)
Chloride: 95 mmol/L — ABNORMAL LOW (ref 98–111)
Creatinine, Ser: 0.81 mg/dL (ref 0.61–1.24)
GFR, Estimated: >60 mL/min (ref >60)
Glucose, Bld: 479 mg/dL — ABNORMAL HIGH (ref 70–99)
Potassium: 3.9 mmol/L (ref 3.5–5.1)
Sodium: 130 mmol/L — ABNORMAL LOW (ref 135–145)

## 2022-10-21 LAB — BETA-HYDROXYBUTYRIC ACID: Beta-Hydroxybutyric Acid: 1.4 mmol/L — ABNORMAL HIGH (ref 0.05–0.27)

## 2022-10-21 MED ORDER — MORPHINE SULFATE (PF) 4 MG/ML IV SOLN
4.0000 mg | Freq: Once | INTRAVENOUS | Status: AC
  Administered 2022-10-21: 4 mg via INTRAVENOUS
  Filled 2022-10-21: qty 1

## 2022-10-21 MED ORDER — ONDANSETRON HCL 4 MG/2ML IJ SOLN: 4.0000 mg | Freq: Four times a day (QID) | INTRAMUSCULAR | Status: DC | PRN

## 2022-10-21 MED ORDER — NITROGLYCERIN IN D5W 200-5 MCG/ML-% IV SOLN
0.0000 ug/min | INTRAVENOUS | Status: DC
  Administered 2022-10-21: 20 ug/min via INTRAVENOUS
  Filled 2022-10-21: qty 250

## 2022-10-21 MED ORDER — ONDANSETRON HCL 4 MG PO TABS: 4.0000 mg | ORAL_TABLET | Freq: Four times a day (QID) | ORAL | Status: DC | PRN

## 2022-10-21 MED ORDER — INSULIN ASPART 100 UNIT/ML IJ SOLN
0.0000 [IU] | Freq: Three times a day (TID) | INTRAMUSCULAR | Status: DC
  Administered 2022-10-22: 5 [IU] via SUBCUTANEOUS
  Administered 2022-10-22 (×2): 11 [IU] via SUBCUTANEOUS
  Administered 2022-10-23: 5 [IU] via SUBCUTANEOUS
  Administered 2022-10-23: 8 [IU] via SUBCUTANEOUS
  Administered 2022-10-23: 15 [IU] via SUBCUTANEOUS
  Administered 2022-10-24: 11 [IU] via SUBCUTANEOUS
  Administered 2022-10-24: 3 [IU] via SUBCUTANEOUS
  Administered 2022-10-24 – 2022-10-25 (×2): 8 [IU] via SUBCUTANEOUS
  Administered 2022-10-25: 5 [IU] via SUBCUTANEOUS
  Administered 2022-10-25 – 2022-10-26 (×2): 3 [IU] via SUBCUTANEOUS

## 2022-10-21 MED ORDER — HEPARIN BOLUS VIA INFUSION
4000.0000 [IU] | Freq: Once | INTRAVENOUS | Status: AC
  Administered 2022-10-21: 4000 [IU] via INTRAVENOUS
  Filled 2022-10-21: qty 4000

## 2022-10-21 MED ORDER — ONDANSETRON HCL 4 MG/2ML IJ SOLN
4.0000 mg | Freq: Once | INTRAMUSCULAR | Status: AC
  Administered 2022-10-21: 4 mg via INTRAVENOUS
  Filled 2022-10-21: qty 2

## 2022-10-21 MED ORDER — ALBUTEROL SULFATE (2.5 MG/3ML) 0.083% IN NEBU: 2.5000 mg | INHALATION_SOLUTION | Freq: Four times a day (QID) | RESPIRATORY_TRACT | Status: DC | PRN

## 2022-10-21 MED ORDER — INSULIN GLARGINE-YFGN 100 UNIT/ML ~~LOC~~ SOLN
10.0000 [IU] | Freq: Every day | SUBCUTANEOUS | Status: DC
  Administered 2022-10-21: 10 [IU] via SUBCUTANEOUS
  Filled 2022-10-21 (×2): qty 0.1

## 2022-10-21 MED ORDER — IOHEXOL 350 MG/ML SOLN
75.0000 mL | Freq: Once | INTRAVENOUS | Status: AC | PRN
  Administered 2022-10-21: 75 mL via INTRAVENOUS

## 2022-10-21 MED ORDER — ASPIRIN 81 MG PO TBEC
81.0000 mg | DELAYED_RELEASE_TABLET | Freq: Every day | ORAL | Status: DC
  Administered 2022-10-21: 81 mg via ORAL
  Filled 2022-10-21: qty 1

## 2022-10-21 MED ORDER — LISINOPRIL 20 MG PO TABS
20.0000 mg | ORAL_TABLET | Freq: Every day | ORAL | Status: DC
  Administered 2022-10-22 – 2022-10-26 (×5): 20 mg via ORAL
  Filled 2022-10-21 (×5): qty 1

## 2022-10-21 MED ORDER — INSULIN ASPART 100 UNIT/ML IJ SOLN
0.0000 [IU] | Freq: Every day | INTRAMUSCULAR | Status: DC
  Administered 2022-10-21: 5 [IU] via SUBCUTANEOUS
  Administered 2022-10-23 – 2022-10-25 (×3): 3 [IU] via SUBCUTANEOUS

## 2022-10-21 MED ORDER — SERTRALINE HCL 50 MG PO TABS
150.0000 mg | ORAL_TABLET | Freq: Every day | ORAL | Status: DC
  Administered 2022-10-22 – 2022-10-26 (×5): 150 mg via ORAL
  Filled 2022-10-21 (×5): qty 1

## 2022-10-21 MED ORDER — SENNOSIDES-DOCUSATE SODIUM 8.6-50 MG PO TABS
1.0000 | ORAL_TABLET | Freq: Every evening | ORAL | Status: DC | PRN
  Filled 2022-10-21: qty 1

## 2022-10-21 MED ORDER — ALBUTEROL SULFATE HFA 108 (90 BASE) MCG/ACT IN AERS: 1.0000 | INHALATION_SPRAY | Freq: Four times a day (QID) | RESPIRATORY_TRACT | Status: DC | PRN

## 2022-10-21 MED ORDER — INSULIN ASPART 100 UNIT/ML IJ SOLN
10.0000 [IU] | Freq: Once | INTRAMUSCULAR | Status: AC
  Administered 2022-10-21: 10 [IU] via SUBCUTANEOUS

## 2022-10-21 MED ORDER — HEPARIN (PORCINE) 25000 UT/250ML-% IV SOLN
1400.0000 [IU]/h | INTRAVENOUS | Status: DC
  Administered 2022-10-21: 1200 [IU]/h via INTRAVENOUS
  Filled 2022-10-21: qty 250

## 2022-10-21 MED ORDER — SODIUM CHLORIDE 0.9 % IV BOLUS
1000.0000 mL | Freq: Once | INTRAVENOUS | Status: AC
  Administered 2022-10-21: 1000 mL via INTRAVENOUS

## 2022-10-21 MED ORDER — ACETAMINOPHEN 650 MG RE SUPP: 650.0000 mg | Freq: Four times a day (QID) | RECTAL | Status: DC | PRN

## 2022-10-21 MED ORDER — ACETAMINOPHEN 325 MG PO TABS
650.0000 mg | ORAL_TABLET | Freq: Four times a day (QID) | ORAL | Status: DC | PRN
  Administered 2022-10-21: 650 mg via ORAL
  Filled 2022-10-21: qty 2

## 2022-10-21 MED ORDER — SODIUM CHLORIDE 0.9% FLUSH
3.0000 mL | Freq: Two times a day (BID) | INTRAVENOUS | Status: DC
  Administered 2022-10-23 – 2022-10-26 (×5): 3 mL via INTRAVENOUS

## 2022-10-21 NOTE — ED Provider Notes (Signed)
  Physical Exam  BP (!) 145/97   Pulse 85   Temp 97.8 F (36.6 C) (Oral)   Resp 16   SpO2 97%   Physical Exam  Procedures  Procedures  ED Course / MDM    Medical Decision Making Care assumed at 4 PM.  Patient is here with chest pain uncontrolled diabetes.  Signout pending dissection study and admission.  8:15 PM Patient is still feeling uncomfortable.  Troponin negative x 2.  Dissection study is negative.  I discussed case with Dr. Odis Hollingshead from cardiology.  He recommends heparin drip for possible unstable angina.  He recommend hospitalist admission and he will consult and likely cath in the morning.  CRITICAL CARE Performed by: Richardean Canal   Total critical care time: 30 minutes  Critical care time was exclusive of separately billable procedures and treating other patients.  Critical care was necessary to treat or prevent imminent or life-threatening deterioration.  Critical care was time spent personally by me on the following activities: development of treatment plan with patient and/or surrogate as well as nursing, discussions with consultants, evaluation of patient's response to treatment, examination of patient, obtaining history from patient or surrogate, ordering and performing treatments and interventions, ordering and review of laboratory studies, ordering and review of radiographic studies, pulse oximetry and re-evaluation of patient's condition.   Problems Addressed: Chest pain, unspecified type: acute illness or injury Hyperglycemia: acute illness or injury Unstable angina [facility]): acute illness or injury  Amount and/or Complexity of Data Reviewed Labs: ordered. Decision-making details documented in ED Course. Radiology: ordered and independent interpretation performed. Decision-making details documented in ED Course.  Risk Prescription drug management. Decision regarding hospitalization.          Charlynne Pander, MD 10/21/22 2016

## 2022-10-21 NOTE — Assessment & Plan Note (Signed)
Continue Zoloft 

## 2022-10-21 NOTE — Assessment & Plan Note (Signed)
Albuterol as needed 

## 2022-10-21 NOTE — H&P (Signed)
History and Physical    Scott Pineda YQM:[id number] DOB: [birth year] DOA: 10/21/2022  PCP: [facility]  Patient coming from: Home  I have personally briefly reviewed patient's old medical records in [facility]  Chief Complaint: Chest pain  HPI: Scott Pineda is a 62 y.o. male with medical history significant for insulin-dependent T2DM, HTN, HLD, asthma, remote history of sarcoidosis in remission, depression/anxiety who presented to the ED for evaluation of chest pain.  Patient states that he has been having chest pain on and off for the last 2 months.  Pain is described as sharp and sometimes pressure-like sensation.  It occurs in the midsternal to left chest area with radiation down his left arm and up his neck.  It may occur with rest.  Sometimes it is associated with dyspnea and diaphoresis.  He underwent nuclear stress testing recently 10/07/2022 in [place] which did not show reversible ischemia or infarction.  Normal LV wall motion, EF 60%.  Patient states that this morning chest pain returned and was very severe.  He also reports cough with deep inspiration.  He denies any known personal cardiac history.  He is adopted and does not know his biological family history.  He is a never smoker but states that he was exposed to smoke in the house growing up.  Patient reports severe intolerance to statin which caused significant myopathy about 12 years ago.  He is not currently on aspirin.  Blood sugars have been poorly controlled recently.  He states that he stopped taking metformin due to GI intolerance and is now on insulin glargine 25 units every evening in addition to glipizide and [place].  ED Course  Labs/Imaging on admission: I have personally reviewed following labs and imaging studies.  Initial vitals showed BP 159/104, pulse 110, RR 20, temp 97.8 F, SpO2 97% on room air.  Labs show sodium 130 (139 when corrected for hyperglycemia), potassium 3.9, bicarb 20, BUN  9, creatinine 0.81, serum glucose 479, WBC 7.7, hemoglobin 16.1, platelets 221,000, troponin 7 > 8, beta hydroxybutyrate 1.40.  CTA chest negative for PE or acute abnormality.  Aortic atherosclerosis noted.  Patient was given 10 units subcutaneous NovoLog, 1 L normal saline, IV morphine 4 mg x 2.  EDP spoke with on-call [facility] cardiology Dr. Odis Hollingshead who recommended starting on IV heparin, medical admission, and potential cardiac cath in the morning.  Patient was started on IV nitroglycerin infusion.  The hospitalist service was consulted to admit for further evaluation and management.  Review of Systems: All systems reviewed and are negative except as documented in history of present illness above.   Past Medical History:  Diagnosis Date   ADVERSE REACTION TO MEDICATION 10/09/2008   Qualifier: Diagnosis of  By: Fabian Sharp MD, Neta Mends    Allergic rhinitis    Asthma    Depression    GERD (gastroesophageal reflux disease)    Gout    Hyperlipidemia    Hypertension    Salivary stone    Trauma    hx of falling trauma and back pain with side effects of medication, parkisonian now resolved    Past Surgical History:  Procedure Laterality Date   APPENDECTOMY     CATARACT EXTRACTION     left 2009 and right 2006   CHOLECYSTECTOMY     HERNIA REPAIR     salivary stone extracted     TONSILLECTOMY      Social History:  reports that he has never smoked. He has  never used smokeless tobacco. He reports that he does not drink alcohol and does not use drugs.  Allergies  Allergen Reactions   Glucophage Xr [Metformin] Other (See Comments)    Abdominal/stomach pain   Motrin [Ibuprofen] Other (See Comments)    Unknown reaction   Statins Other (See Comments)    Myalgias  Weakness of significance     Family History  Adopted: Yes  Family history unknown: Yes     Prior to Admission medications   Medication Sig Start Date End Date Taking? Authorizing Provider  lisinopril (PRINIVIL,ZESTRIL) 20  MG tablet TAKE 1 TABLET (20 MG TOTAL) BY MOUTH DAILY. 10/22/16   Panosh, Neta Mends, MD  sertraline (ZOLOFT) 100 MG tablet TAKE 1 TABLET (100 MG TOTAL) BY MOUTH DAILY. 05/02/16   Madelin Headings, MD    Physical Exam: Vitals:   10/21/22 2036 10/21/22 2045 10/21/22 2100 10/21/22 2115  BP:  (!) 152/97 (!) 153/88 (!) 159/95  Pulse:  92 95 89  Resp:  16 12 10   Temp:      TempSrc:      SpO2:  95% 98% 98%  Weight: 86.4 kg     Height: 6' (1.829 m)      Constitutional: Resting in bed, appears fatigued and NAD, calm, comfortable Eyes: EOMI, lids and conjunctivae normal ENMT: Mucous membranes are moist. Posterior pharynx clear of any exudate or lesions.Normal dentition.  Neck: normal, supple, no masses. Respiratory: clear to auscultation bilaterally, cough elicited with deep inspiration. Normal respiratory effort. No accessory muscle use.  Cardiovascular: Regular rate and rhythm, soft systolic murmur noted. No extremity edema. 2+ pedal pulses. Abdomen: no tenderness, no masses palpated.  Musculoskeletal: no clubbing / cyanosis. No joint deformity upper and lower extremities. Good ROM, no contractures. Normal muscle tone.  Skin: no rashes, lesions, ulcers. No induration Neurologic: Sensation intact. Strength 5/5 in all 4.  Psychiatric: Normal judgment and insight. Alert and oriented x 3. Normal mood.   EKG: Personally reviewed. Sinus tach, rate 105, no acute ischemic changes.  Not significantly changed when compared to previous from September 2018.  Assessment/Plan Principal Problem:   Chest pain Active Problems:   Type 2 diabetes mellitus with hyperglycemia, with long-term current use of insulin (HCC)   Hypertension associated with diabetes (HCC)   Hyperlipidemia associated with type 2 diabetes mellitus (HCC)   Depression   Asthma   Statin intolerance   Scott Pineda is a 62 y.o. male with medical history significant for insulin-dependent T2DM, HTN, HLD, asthma, remote history of  sarcoidosis in remission, depression/anxiety who is admitted for evaluation of chest pain.  [facility] cardiology consulted and plan for cardiac cath during admission.  Assessment and Plan: * Chest pain With features of unstable angina.  He has had recent normal stress test at OSH.  Troponin is negative x 2 and EKG without acute ischemic changes.  EDP has discussed with cardiology Dr. Odis Hollingshead who recommended admission for potential cardiac cath tomorrow. -Continue IV heparin -Continue IV nitroglycerin -Obtain echocardiogram -Start aspirin 81 mg daily -Not on statin due to severe intolerance per patient -Keep n.p.o. after midnight  Type 2 diabetes mellitus with hyperglycemia, with long-term current use of insulin (HCC) Hyperglycemic on admission without evidence of DKA/HHS.  Placed on Semglee 10 units nightly and SSI.  Hypertension associated with diabetes (HCC) Continue lisinopril 20 mg daily.  Hyperlipidemia associated with type 2 diabetes mellitus (HCC) Not on statin due to reported severe intolerance.  Check lipid panel.  Asthma Albuterol as  needed.  Depression Continue Zoloft.  DVT prophylaxis: IV heparin Code Status: Full code, confirmed with patient on admission Family Communication: Spouse at bedside Disposition Plan: From home, dispo pending clinical progress Consults called: Cardiology (Dr. Odis Hollingshead) Severity of Illness: The appropriate patient status for this patient is INPATIENT. Inpatient status is judged to be reasonable and necessary in order to provide the required intensity of service to ensure the patient's safety. The patient's presenting symptoms, physical exam findings, and initial radiographic and laboratory data in the context of their chronic comorbidities is felt to place them at high risk for further clinical deterioration. Furthermore, it is not anticipated that the patient will be medically stable for discharge from the hospital within 2 midnights of admission.    * I certify that at the point of admission it is my clinical judgment that the patient will require inpatient hospital care spanning beyond 2 midnights from the point of admission due to high intensity of service, high risk for further deterioration and high frequency of surveillance required.Darreld Mclean MD [facility]  If 7PM-7AM, please contact night-coverage [email or web address]  10/21/2022, 9:43 PM

## 2022-10-21 NOTE — Hospital Course (Signed)
Scott Pineda is a 62 y.o. male with medical history significant for insulin-dependent T2DM, HTN, HLD, asthma, remote history of sarcoidosis in remission, depression/anxiety who is admitted for evaluation of chest pain.  [facility] cardiology consulted and plan for cardiac cath during admission.

## 2022-10-21 NOTE — Progress Notes (Signed)
ANTICOAGULATION CONSULT NOTE - Initial Consult  Pharmacy Consult for Heparin Indication: chest pain/ACS  Allergies  Allergen Reactions   Glucophage Xr [Metformin] Other (See Comments)    Abdominal/stomach pain   Motrin [Ibuprofen] Other (See Comments)    Unknown reaction   Statins Other (See Comments)    Myalgias  Weakness of significance     Patient Measurements:   Heparin Dosing Weight: 86.4 kg  Vital Signs: Temp: 97.8 F (36.6 C) (09/03 2005) Temp Source: Oral (09/03 2005) BP: 145/97 (09/03 1945) Pulse Rate: 85 (09/03 1945)  Labs: Recent Labs    10/21/22 1349 10/21/22 1553  HGB 16.1  --   HCT 45.5  --   PLT 221  --   CREATININE 0.81  --   TROPONINIHS 7 8    CrCl cannot be calculated (Unknown ideal weight.).   Medical History: Past Medical History:  Diagnosis Date   ADVERSE REACTION TO MEDICATION 10/09/2008   Qualifier: Diagnosis of  By: Fabian Sharp MD, Neta Mends    Allergic rhinitis    Asthma    Depression    GERD (gastroesophageal reflux disease)    Gout    Hyperlipidemia    Hypertension    Salivary stone    Trauma    hx of falling trauma and back pain with side effects of medication, parkisonian now resolved    Medications:  (Not in a hospital admission)  Scheduled:  Infusions:   nitroGLYCERIN 20 mcg/min (10/21/22 2026)   PRN:   Assessment: 61 yom with a history of HTN, HLD, DM. Patient is presenting with chest pain and SOB. Heparin per pharmacy consult placed for chest pain/ACS.  Patient is not on anticoagulation prior to arrival.  Hgb 16.1; plt 221  Goal of Therapy:  Heparin level 0.3-0.7 units/ml Monitor platelets by anticoagulation protocol: Yes   Plan:  Give IV heparin 4000 units bolus x 1 Start heparin infusion at 1200 units/hr Check anti-Xa level in 6 hours and daily while on heparin Continue to monitor H&H and platelets  Delmar Landau, PharmD, BCPS 10/21/2022 8:33 PM ED Clinical Pharmacist -  [phone]

## 2022-10-21 NOTE — ED Provider Notes (Signed)
[facility]ERGENCY DEPARTMENT AT [facility] Provider Note   CSN: [id number] Arrival date & time: 10/21/22  1322     History  Chief Complaint  Patient presents with   Chest Pain   Shortness of Breath    Scott Pineda is a 62 y.o. male.  Patient is a 62 yo male with pmh of htn, hyperlipemia, DM on insulin, and gerd presenting for chest pain. Patient located sternally, pressure-like and squeezing, radiating to the neck, that started this morning while laying in bed.  Admits to associated nausea. No diaphoresis. No fever, chills, or coughing. Admits to intermittent lower extremity swelling but none currently. Never smoker.  The history is provided by the patient. No language interpreter was used.  Chest Pain Associated symptoms: shortness of breath   Associated symptoms: no abdominal pain, no back pain, no cough, no fever, no palpitations and no vomiting   Shortness of Breath Associated symptoms: chest pain   Associated symptoms: no abdominal pain, no cough, no ear pain, no fever, no rash, no sore throat and no vomiting        Home Medications Prior to Admission medications   Medication Sig Start Date End Date Taking? Authorizing Provider  APPLE CIDER VINEGAR PO Take by mouth.      [provider]  dapagliflozin propanediol (FARXIGA) 10 MG TABS tablet Take 10 mg by mouth daily. Patient not taking: Reported on 07/28/2016 05/02/16   Panosh, Neta Mends, MD  lisinopril (PRINIVIL,ZESTRIL) 20 MG tablet TAKE 1 TABLET (20 MG TOTAL) BY MOUTH DAILY. 10/22/16   Panosh, Neta Mends, MD  metFORMIN (GLUCOPHAGE-XR) 500 MG 24 hr tablet TAKE 4 TABLETS PER DAY AS DIRECTED Patient taking differently: Take 1,000 mg by mouth 2 (two) times daily.  05/02/16   Panosh, Neta Mends, MD  Multiple Vitamin (MULTIVITAMIN) capsule Take 1 capsule by mouth daily.    [provider]  sertraline (ZOLOFT) 100 MG tablet TAKE 1 TABLET (100 MG TOTAL) BY MOUTH DAILY. 05/02/16   Panosh, Neta Mends, MD   sitaGLIPtin (JANUVIA) 100 MG tablet Take 1 tablet (100 mg total) by mouth daily. 05/13/16   Panosh, Neta Mends, MD      Allergies    Ibuprofen and Statins    Review of Systems   Review of Systems  Constitutional:  Negative for chills and fever.  HENT:  Negative for ear pain and sore throat.   Eyes:  Negative for pain and visual disturbance.  Respiratory:  Positive for shortness of breath. Negative for cough.   Cardiovascular:  Positive for chest pain. Negative for palpitations.  Gastrointestinal:  Negative for abdominal pain and vomiting.  Genitourinary:  Negative for dysuria and hematuria.  Musculoskeletal:  Negative for arthralgias and back pain.  Skin:  Negative for color change and rash.  Neurological:  Negative for seizures and syncope.  All other systems reviewed and are negative.   Physical Exam Updated Vital Signs BP (!) 160/97 (BP Location: Right Arm)   Pulse (!) 107   Temp 98.1 F (36.7 C) (Oral)   Resp 19   SpO2 93%  Physical Exam Vitals and nursing note reviewed.  Constitutional:      General: He is not in acute distress.    Appearance: He is well-developed.  HENT:     Head: Normocephalic and atraumatic.  Eyes:     Conjunctiva/sclera: Conjunctivae normal.  Cardiovascular:     Rate and Rhythm: Normal rate and regular rhythm.     Heart sounds: No  murmur heard. Pulmonary:     Effort: Pulmonary effort is normal. No respiratory distress.     Breath sounds: Normal breath sounds.  Abdominal:     Palpations: Abdomen is soft.     Tenderness: There is no abdominal tenderness.  Musculoskeletal:        General: No swelling.     Cervical back: Neck supple.     Right lower leg: No edema.     Left lower leg: No edema.  Skin:    General: Skin is warm and dry.     Capillary Refill: Capillary refill takes less than 2 seconds.  Neurological:     Mental Status: He is alert.  Psychiatric:        Mood and Affect: Mood normal.     ED Results / Procedures / Treatments    Labs (all labs ordered are listed, but only abnormal results are displayed) Labs Reviewed  BASIC METABOLIC PANEL - Abnormal; Notable for the following components:      Result Value   Sodium 130 (*)    Chloride 95 (*)    CO2 20 (*)    Glucose, Bld 479 (*)    All other components within normal limits  BLOOD GAS, VENOUS - Abnormal; Notable for the following components:   pCO2, Ven 41 (*)    Acid-Base Excess 2.6 (*)    All other components within normal limits  CBG MONITORING, ED - Abnormal; Notable for the following components:   Glucose-Capillary 523 (*)    All other components within normal limits  CBG MONITORING, ED - Abnormal; Notable for the following components:   Glucose-Capillary 386 (*)    All other components within normal limits  CBC  BETA-HYDROXYBUTYRIC ACID  TROPONIN I (HIGH SENSITIVITY)  TROPONIN I (HIGH SENSITIVITY)    EKG EKG Interpretation Date/Time:  Tuesday October 21 2022 13:50:16 EDT Ventricular Rate:  105 PR Interval:  144 QRS Duration:  88 QT Interval:  334 QTC Calculation: 441 R Axis:   -54  Text Interpretation: Sinus tachycardia Left axis deviation Abnormal ECG When compared with ECG of 20-Oct-2016 19:05, PREVIOUS ECG IS PRESENT Confirmed by Edwin Dada ([id number]) on 10/21/2022 2:33:36 PM  Radiology No results found.  Procedures Procedures    Medications Ordered in ED Medications  insulin aspart (novoLOG) injection 10 Units (has no administration in time range)  sodium chloride 0.9 % bolus 1,000 mL (1,000 mLs Intravenous New Bag/Given 10/21/22 1557)  morphine (PF) 4 MG/ML injection 4 mg (4 mg Intravenous Given 10/21/22 1547)  ondansetron (ZOFRAN) injection 4 mg (4 mg Intravenous Given 10/21/22 1545)    ED Course/ Medical Decision Making/ A&P                                 Medical Decision Making Amount and/or Complexity of Data Reviewed Labs: ordered. Radiology: ordered.  Risk Prescription drug management.   4:30 PM 61 yo male with pmh  of htn, hyperlipemia, and gerd presenting for chest pain.  Patient is alert and oriented x 3, no acute distress, afebrile, some vital signs.  No diaphoresis on exam.  Equal bilateral breath sounds with no adventitious lung sounds.  Some tachycardia at 107 to 95 bpm.  Hypertension at 160/97.  Patient given morphine and Zofran for symptomatic management of chest pain and nausea.  Initial EKG demonstrates sinus rhythm.  Troponins pending.  CTA chest abdomen pelvis pending to rule out dissection.  Patient  does have hyperglycemia.  Is calm down after some IV fluids.  No DKA.  Small anion gap.  Units insulin given.  Initial troponin stable.  Repeat troponin pending.  CT chest abdomen pelvis pending.  Patient signed out to oncoming provider at shift change Dr. Silverio Lay.        Final Clinical Impression(s) / ED Diagnoses Final diagnoses:  Hyperglycemia  Chest pain, unspecified type    Rx / DC Orders ED Discharge Orders     None         Franne Forts, DO 10/21/22 1630

## 2022-10-21 NOTE — Assessment & Plan Note (Signed)
With features of unstable angina.  He has had recent normal stress test at OSH.  Troponin is negative x 2 and EKG without acute ischemic changes.  EDP has discussed with cardiology Dr. Odis Hollingshead who recommended admission for potential cardiac cath tomorrow. -Continue IV heparin -Continue IV nitroglycerin -Obtain echocardiogram -Start aspirin 81 mg daily -Not on statin due to severe intolerance per patient -Keep n.p.o. after midnight

## 2022-10-21 NOTE — Assessment & Plan Note (Signed)
Continue lisinopril 20 mg daily

## 2022-10-21 NOTE — Assessment & Plan Note (Signed)
Hyperglycemic on admission without evidence of DKA/HHS.  Placed on Semglee 10 units nightly and SSI.

## 2022-10-21 NOTE — ED Provider Triage Note (Signed)
Emergency Medicine Provider Triage Evaluation Note  Scott Pineda , a 62 y.o. male  was evaluated in triage.  Pt complains of intermittent cough, left sided chest pain, SOB.  Review of Systems  Positive:  Negative:   Physical Exam  BP (!) 160/97 (BP Location: Right Arm)   Pulse (!) 107   Temp 98.1 F (36.7 C) (Oral)   Resp 19   SpO2 93%  Gen:   Awake, no distress   Resp:  Normal effort  MSK:   Moves extremities without difficulty  Other:    Medical Decision Making  Medically screening exam initiated at 1:53 PM.  Appropriate orders placed.  [name] was informed that the remainder of the evaluation will be completed by another provider, this initial triage assessment does not replace that evaluation, and the importance of remaining in the ED until their evaluation is complete.   Concerned for intermittent cough, left sided chest pain, SOB x2 months. Also with DOE. 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 States that he has constant leg pain d/t restless leg syndrome. Also endorses sarcoidosis that has been in remission for many years.    Dorthy Cooler, [place] 10/21/22 1357

## 2022-10-21 NOTE — Progress Notes (Signed)
ON-CALL CARDIOLOGY 10/21/22  Patient's name: Scott Pineda.   MRN: [id number].    DOB: 1960/06/19 Primary care provider: [facility]. Primary cardiologist: Unknown  Interaction regarding this patient's care today: Presents to the hospital with chief complaint of chest pain.  Symptoms are concerning for cardiac discomfort.  Patient is being admitted to medicine and cardiology was consulted for further recommendations given his presentation and chest pain.  Symptoms are ongoing for the last several weeks.  Per ED physician he had a stress test at [facility] about a week ago which was reported to be normal.  However due to continued symptoms he presents to the hospital for further evaluation and management  Impression: Unstable angina Poorly controlled diabetes mellitus type 2. Hypertension Hyperlipidemia  Recommendations: High sensitive troponins are negative x 2. EKG initially notes sinus rhythm without underlying injury pattern But presentation is very concerning for unstable angina. If no current indications to systemic anticoagulation would recommend IV heparin per ACS protocol. Start IV nitro drip for chest pain management Echocardiogram will be ordered to evaluate for structural heart disease and left ventricular systolic function..  Please obtain lipid profile, A1c, and TSH.  Give 325 mg ASA (if not already given) and continue 81 mg daily. Give 80 mg atorvastatin and continue daily. The patient is currently without chest pain and would give sublingual nitroglycerin if chest pain returns. Remain on telemetry and obtain a stat EKG should chest pain return or change in character/quality.  Keep NPO at midnight and will plan for LHC in the AM.  Should symptoms of chest pain continue despite 3 sublingual nitroglycerin tablets, the patient becomes hemodynamically unstable (hypotension, congestive heart failure), or develop electrical instability (VT/VF), please notify  cardiology on call for urgent evaluation.  Our service will follow along, please call with questions.    Full consult note forthcoming  Delilah Shan [facility]  Pager: [phone] Office: [phone]

## 2022-10-21 NOTE — ED Triage Notes (Signed)
Pt c/o left sided chest pain that radiates to left neck and down left arm, [id number]. Pt c/o int dizzinessx2ys.

## 2022-10-21 NOTE — ED Notes (Signed)
ED TO INPATIENT HANDOFF REPORT  ED Nurse Name and Phone #: [phone]  S Name/Age/Gender Scott Pineda 62 y.o. male Room/Bed: 009C/009C  Code Status   Code Status: Full Code  Home/SNF/Other Home Patient oriented to: self, place, time, and situation Is this baseline? Yes   Triage Complete: Triage complete  Chief Complaint Chest pain [R07.9]  Triage Note Pt c/o left sided chest pain that radiates to left neck and down left arm, [id number]. Pt c/o int dizzinessx2ys.   Allergies Allergies  Allergen Reactions   Glucophage Xr [Metformin] Other (See Comments)    Abdominal/stomach pain   Motrin [Ibuprofen] Other (See Comments)    Unknown reaction   Statins Other (See Comments)    Myalgias  Weakness of significance     Level of Care/Admitting Diagnosis ED Disposition     ED Disposition  Admit   Condition  --   Comment  Hospital Area: [facility] [[id number]]  Level of Care: Progressive [102]  Admit to Progressive based on following criteria: CARDIOVASCULAR & THORACIC of moderate stability with acute coronary syndrome symptoms/low risk myocardial infarction/hypertensive urgency/arrhythmias/heart failure potentially compromising stability and stable post cardiovascular intervention patients.  May admit patient to [facility] or [facility] if equivalent level of care is available:: No  Covid Evaluation: Asymptomatic - no recent exposure (last 10 days) testing not required  Diagnosis: Chest pain [744799]  Admitting Physician: Charlsie Quest [[id number]]  Attending Physician: Charlsie Quest [[id number]]  Certification:: I certify this patient will need inpatient services for at least 2 midnights          B Medical/Surgery History Past Medical History:  Diagnosis Date   ADVERSE REACTION TO MEDICATION 10/09/2008   Qualifier: Diagnosis of  By: Fabian Sharp MD, Neta Mends    Allergic rhinitis    Asthma    Depression    GERD (gastroesophageal reflux disease)    Gout     Hyperlipidemia    Hypertension    Salivary stone    Trauma    hx of falling trauma and back pain with side effects of medication, parkisonian now resolved   Past Surgical History:  Procedure Laterality Date   APPENDECTOMY     CATARACT EXTRACTION     left 2009 and right 2006   CHOLECYSTECTOMY     HERNIA REPAIR     salivary stone extracted     TONSILLECTOMY       A IV Location/Drains/Wounds Patient Lines/Drains/Airways Status     Active Line/Drains/Airways     Name Placement date Placement time Site Days   Peripheral IV 10/21/22 20 G Right Antecubital 10/21/22  1435  Antecubital  less than 1            Intake/Output Last 24 hours  Intake/Output Summary (Last 24 hours) at 10/21/2022 2152 Last data filed at 10/21/2022 1912 Gross per 24 hour  Intake 1000 ml  Output --  Net 1000 ml    Labs/Imaging Results for orders placed or performed during the hospital encounter of 10/21/22 (from the past 48 hour(s))  Basic metabolic panel     Status: Abnormal   Collection Time: 10/21/22  1:49 PM  Result Value Ref Range   Sodium 130 (L) 135 - 145 mmol/L   Potassium 3.9 3.5 - 5.1 mmol/L   Chloride 95 (L) 98 - 111 mmol/L   CO2 20 (L) 22 - 32 mmol/L   Glucose, Bld 479 (H) 70 - 99 mg/dL    Comment: Glucose reference range  applies only to samples taken after fasting for at least 8 hours.   BUN 9 8 - 23 mg/dL   Creatinine, Ser 1.61 0.61 - 1.24 mg/dL   Calcium 9.3 8.9 - 09.6 mg/dL   GFR, Estimated >04 >54 mL/min    Comment: (NOTE) Calculated using the CKD-EPI Creatinine Equation (2021)    Anion gap 15 5 - 15    Comment: Performed at [facility], [address], [place], [place] [address]  CBC     Status: None   Collection Time: 10/21/22  1:49 PM  Result Value Ref Range   WBC 7.7 4.0 - 10.5 K/uL   RBC 5.17 4.22 - 5.81 MIL/uL   Hemoglobin 16.1 13.0 - 17.0 g/dL   HCT 91.4 78.2 - 95.6 %   MCV 88.0 80.0 - 100.0 fL   MCH 31.1 26.0 - 34.0 pg   MCHC 35.4 30.0 - 36.0 g/dL    RDW 21.3 08.6 - 57.8 %   Platelets 221 150 - 400 K/uL   nRBC 0.0 0.0 - 0.2 %    Comment: Performed at [facility], [address] [place], [place] [address]  Troponin I (High Sensitivity)     Status: None   Collection Time: 10/21/22  1:49 PM  Result Value Ref Range   Troponin I (High Sensitivity) 7 <18 ng/L    Comment: (NOTE) Elevated high sensitivity troponin I (hsTnI) values and significant  changes across serial measurements may suggest ACS but many other  chronic and acute conditions are known to elevate hsTnI results.  Refer to the "Links" section for chest pain algorithms and additional  guidance. Performed at [facility], [address] [place], [place] [address]   CBG monitoring, ED     Status: Abnormal   Collection Time: 10/21/22  1:49 PM  Result Value Ref Range   Glucose-Capillary 523 (HH) 70 - 99 mg/dL    Comment: Glucose reference range applies only to samples taken after fasting for at least 8 hours.   Comment 1 Notify RN   Blood gas, venous (at [facility])     Status: Abnormal   Collection Time: 10/21/22  3:51 PM  Result Value Ref Range   pH, Ven 7.43 7.25 - 7.43   pCO2, Ven 41 (L) 44 - 60 mmHg   pO2, Ven 45 32 - 45 mmHg   Bicarbonate 27.2 20.0 - 28.0 mmol/L   Acid-Base Excess 2.6 (H) 0.0 - 2.0 mmol/L   O2 Saturation 80.2 %   Patient temperature 37.0    Drawn by DRAWN BY RN     Comment: Performed at [facility], [address] [place], [place] [address]  Beta-hydroxybutyric acid     Status: Abnormal   Collection Time: 10/21/22  3:53 PM  Result Value Ref Range   Beta-Hydroxybutyric Acid 1.40 (H) 0.05 - 0.27 mmol/L    Comment: Performed at [facility], [address] [place], [place] [address]  Troponin I (High Sensitivity)     Status: None   Collection Time: 10/21/22  3:53 PM  Result Value Ref Range   Troponin I (High Sensitivity) 8 <18 ng/L    Comment: (NOTE) Elevated high sensitivity troponin I (hsTnI) values and significant   changes across serial measurements may suggest ACS but many other  chronic and acute conditions are known to elevate hsTnI results.  Refer to the "Links" section for chest pain algorithms and additional  guidance. Performed at  [facility], [address] [place], [place] [address]   POC CBG, ED     Status: Abnormal   Collection Time: 10/21/22  4:07 PM  Result Value Ref Range   Glucose-Capillary 386 (H) 70 - 99 mg/dL    Comment: Glucose reference range applies only to samples taken after fasting for at least 8 hours.   CT Angio Chest PE W/Cm &/Or Wo Cm  Result Date: 10/21/2022 CLINICAL DATA:  Shortness of breath.  PE suspected EXAM: CT ANGIOGRAPHY CHEST WITH CONTRAST TECHNIQUE: Multidetector CT imaging of the chest was performed using the standard protocol during bolus administration of intravenous contrast. Multiplanar CT image reconstructions and MIPs were obtained to evaluate the vascular anatomy. RADIATION DOSE REDUCTION: This exam was performed according to the departmental dose-optimization program which includes automated exposure control, adjustment of the mA and/or kV according to patient size and/or use of iterative reconstruction technique. CONTRAST:  75mL OMNIPAQUE IOHEXOL 350 MG/ML SOLN COMPARISON:  Chest radiograph 10/21/2022 and CT chest report from 08/24/2020 FINDINGS: Cardiovascular: Negative for acute pulmonary embolism. Normal caliber thoracic aorta. No pericardial effusion. Aortic and coronary artery atherosclerotic calcification. Mediastinum/Nodes: Trachea and esophagus are unremarkable. No thoracic adenopathy Lungs/Pleura: No focal consolidation, pleural effusion, or pneumothorax. Upper Abdomen: No acute abnormality. Musculoskeletal: No acute fracture. Review of the MIP images confirms the above findings. IMPRESSION: Negative for acute pulmonary embolism. No acute abnormality in the chest. Aortic Atherosclerosis (ICD10-I70.0). Electronically Signed   By: Minerva Fester  M.D.   On: 10/21/2022 18:14   DG Chest 2 View  Result Date: 10/21/2022 CLINICAL DATA:  Major chest pain that radiates into the left arm and neck EXAM: CHEST - 2 VIEW COMPARISON:  X-ray 09/03/2022 FINDINGS: The heart size and mediastinal contours are within normal limits. No consolidation, pneumothorax or effusion. No edema. The visualized skeletal structures are unremarkable. Mild pectus excavatum on lateral view. Overlapping cardiac leads. IMPRESSION: No acute cardiopulmonary disease. Electronically Signed   By: Karen Kays M.D.   On: 10/21/2022 17:58    Pending Labs Unresulted Labs (From admission, onward)     Start     Ordered   10/23/22 0500  Heparin level (unfractionated)  Daily,   R      10/21/22 2038   10/22/22 0500  HIV Antibody (routine testing w rflx)  (HIV Antibody (Routine testing w reflex) panel)  Tomorrow morning,   R        10/21/22 2105   10/22/22 0500  Basic metabolic panel  Tomorrow morning,   R        10/21/22 2105   10/22/22 0500  CBC  Tomorrow morning,   R        10/21/22 2105   10/22/22 0500  Hemoglobin A1c  Tomorrow morning,   R        10/21/22 2105   10/22/22 0500  Lipid panel  Tomorrow morning,   R        10/21/22 2105   10/22/22 0300  Heparin level (unfractionated)  Once-Timed,   TIMED        10/21/22 2038            Vitals/Pain Today's Vitals   10/21/22 2100 10/21/22 2115 10/21/22 2130 10/21/22 2145  BP: (!) 153/88 (!) 159/95 (!) 147/98 137/84  Pulse: 95 89 88 (!) 109  Resp: 12 10 20 20   Temp:      TempSrc:      SpO2: 98% 98% 98% 98%  Weight:  Height:      PainSc:        Isolation Precautions No active isolations  Medications Medications  nitroGLYCERIN 50 mg in dextrose 5 % 250 mL (0.2 mg/mL) infusion (20 mcg/min Intravenous New Bag/Given 10/21/22 2026)  heparin ADULT infusion 100 units/mL (25000 units/283mL) (1,200 Units/hr Intravenous New Bag/Given 10/21/22 2058)  sodium chloride flush (NS) 0.9 % injection 3 mL (3 mLs Intravenous Not  Given 10/21/22 2105)  acetaminophen (TYLENOL) tablet 650 mg (has no administration in time range)    Or  acetaminophen (TYLENOL) suppository 650 mg (has no administration in time range)  ondansetron (ZOFRAN) tablet 4 mg (has no administration in time range)    Or  ondansetron (ZOFRAN) injection 4 mg (has no administration in time range)  senna-docusate (Senokot-S) tablet 1 tablet (has no administration in time range)  insulin glargine-yfgn (SEMGLEE) injection 10 Units (has no administration in time range)  insulin aspart (novoLOG) injection 0-15 Units (has no administration in time range)  insulin aspart (novoLOG) injection 0-5 Units (has no administration in time range)  albuterol (PROVENTIL) (2.5 MG/3ML) 0.083% nebulizer solution 2.5 mg (has no administration in time range)  lisinopril (ZESTRIL) tablet 20 mg (has no administration in time range)  sertraline (ZOLOFT) tablet 150 mg (has no administration in time range)  aspirin EC tablet 81 mg (has no administration in time range)  sodium chloride 0.9 % bolus 1,000 mL (0 mLs Intravenous Stopped 10/21/22 1912)  morphine (PF) 4 MG/ML injection 4 mg (4 mg Intravenous Given 10/21/22 1547)  ondansetron (ZOFRAN) injection 4 mg (4 mg Intravenous Given 10/21/22 1545)  insulin aspart (novoLOG) injection 10 Units (10 Units Subcutaneous Given 10/21/22 1651)  iohexol (OMNIPAQUE) 350 MG/ML injection 75 mL (75 mLs Intravenous Contrast Given 10/21/22 1637)  morphine (PF) 4 MG/ML injection 4 mg (4 mg Intravenous Given 10/21/22 1724)  heparin bolus via infusion 4,000 Units (4,000 Units Intravenous Bolus from Bag 10/21/22 2058)    Mobility walks     Focused Assessments Cardiac Assessment Handoff:  Cardiac Rhythm: Normal sinus rhythm No results found for: "CKTOTAL", "CKMB", "CKMBINDEX", "TROPONINI" No results found for: "DDIMER" Does the Patient currently have chest pain? Yes    R Recommendations: See Admitting Provider Note  Report given to:   Additional  Notes:  on nitroglycerin at 45ml/hr  on heparin at 90ml/hr a/o x 4 vs wnl cp now 3/10 diabetic

## 2022-10-21 NOTE — Assessment & Plan Note (Signed)
Not on statin due to reported severe intolerance.  Check lipid panel.

## 2022-10-22 ENCOUNTER — Encounter (HOSPITAL_COMMUNITY)
Admission: EM | Disposition: A | Discharge status: Home Health | Payer: Self-pay | Source: Home / Self Care | Attending: Internal Medicine

## 2022-10-22 ENCOUNTER — Other Ambulatory Visit (HOSPITAL_COMMUNITY): Payer: PRIVATE HEALTH INSURANCE

## 2022-10-22 ENCOUNTER — Observation Stay (HOSPITAL_COMMUNITY): Payer: 59

## 2022-10-22 DIAGNOSIS — Z794 Long term (current) use of insulin: Secondary | ICD-10-CM | POA: Diagnosis not present

## 2022-10-22 DIAGNOSIS — E781 Pure hyperglyceridemia: Secondary | ICD-10-CM

## 2022-10-22 DIAGNOSIS — I200 Unstable angina: Secondary | ICD-10-CM

## 2022-10-22 DIAGNOSIS — E785 Hyperlipidemia, unspecified: Secondary | ICD-10-CM

## 2022-10-22 DIAGNOSIS — E119 Type 2 diabetes mellitus without complications: Secondary | ICD-10-CM

## 2022-10-22 HISTORY — PX: LEFT HEART CATH AND CORONARY ANGIOGRAPHY: CATH118249

## 2022-10-22 HISTORY — PX: CORONARY PRESSURE/FFR STUDY: CATH118243

## 2022-10-22 LAB — RAPID URINE DRUG SCREEN, HOSP PERFORMED
Amphetamines: NOT DETECTED
Barbiturates: NOT DETECTED
Benzodiazepines: NOT DETECTED
Cocaine: NOT DETECTED
Opiates: NOT DETECTED
Tetrahydrocannabinol: NOT DETECTED

## 2022-10-22 LAB — HIV ANTIBODY (ROUTINE TESTING W REFLEX): HIV Screen 4th Generation wRfx: NONREACTIVE

## 2022-10-22 LAB — CBC
HCT: 39.7 % (ref 39.0–52.0)
Hemoglobin: 13.5 g/dL (ref 13.0–17.0)
MCH: 29.7 pg (ref 26.0–34.0)
MCHC: 34 g/dL (ref 30.0–36.0)
MCV: 87.4 fL (ref 80.0–100.0)
Platelets: 183 10*3/uL (ref 150–400)
RBC: 4.54 MIL/uL (ref 4.22–5.81)
RDW: 13 % (ref 11.5–15.5)
WBC: 7.4 10*3/uL (ref 4.0–10.5)
nRBC: 0 % (ref 0.0–0.2)

## 2022-10-22 LAB — GLUCOSE, CAPILLARY
Glucose-Capillary: 330 mg/dL — ABNORMAL HIGH (ref 70–99)
Glucose-Capillary: 169 mg/dL — ABNORMAL HIGH (ref 70–99)

## 2022-10-22 LAB — BASIC METABOLIC PANEL
Anion gap: 14 (ref 5–15)
BUN: 7 mg/dL — ABNORMAL LOW (ref 8–23)
CO2: 21 mmol/L — ABNORMAL LOW (ref 22–32)
Calcium: 8.1 mg/dL — ABNORMAL LOW (ref 8.9–10.3)
Chloride: 100 mmol/L (ref 98–111)
Creatinine, Ser: 0.78 mg/dL (ref 0.61–1.24)
GFR, Estimated: >60 mL/min (ref >60)
Glucose, Bld: 274 mg/dL — ABNORMAL HIGH (ref 70–99)
Potassium: 3.1 mmol/L — ABNORMAL LOW (ref 3.5–5.1)
Sodium: 135 mmol/L (ref 135–145)

## 2022-10-22 LAB — HEMOGLOBIN A1C
Hgb A1c MFr Bld: 13.3 % — ABNORMAL HIGH (ref 4.8–5.6)
Mean Plasma Glucose: 335.01 mg/dL

## 2022-10-22 LAB — ECHOCARDIOGRAM COMPLETE
AR max vel: 2.91 cm2
AV Peak grad: 6.4 mmHg
Ao pk vel: 1.26 m/s
Area-P 1/2: 5.13 cm2
Height: 72 in
S' Lateral: 3 cm
Weight: 3047.64 [oz_av]

## 2022-10-22 LAB — MAGNESIUM: Magnesium: 1.9 mg/dL (ref 1.7–2.4)

## 2022-10-22 LAB — HEPARIN LEVEL (UNFRACTIONATED): Heparin Unfractionated: 0.25 [IU]/mL — ABNORMAL LOW (ref 0.30–0.70)

## 2022-10-22 LAB — CBG MONITORING, ED
Glucose-Capillary: 255 mg/dL — ABNORMAL HIGH (ref 70–99)
Glucose-Capillary: 303 mg/dL — ABNORMAL HIGH (ref 70–99)
Glucose-Capillary: 327 mg/dL — ABNORMAL HIGH (ref 70–99)

## 2022-10-22 LAB — LIPID PANEL
Cholesterol: 227 mg/dL — ABNORMAL HIGH (ref 0–200)
HDL: 30 mg/dL — ABNORMAL LOW (ref >40)
LDL Cholesterol: 128 mg/dL — ABNORMAL HIGH (ref 0–99)
Total CHOL/HDL Ratio: 7.6 ratio
Triglycerides: 345 mg/dL — ABNORMAL HIGH (ref <150)
VLDL: 69 mg/dL — ABNORMAL HIGH (ref 0–40)

## 2022-10-22 LAB — POCT ACTIVATED CLOTTING TIME: Activated Clotting Time: 226 s

## 2022-10-22 SURGERY — LEFT HEART CATH AND CORONARY ANGIOGRAPHY
Anesthesia: LOCAL

## 2022-10-22 MED ORDER — LIVING WELL WITH DIABETES BOOK
Freq: Once | Status: DC
  Filled 2022-10-22: qty 1

## 2022-10-22 MED ORDER — HEPARIN (PORCINE) IN NACL 1000-0.9 UT/500ML-% IV SOLN
INTRAVENOUS | Status: DC | PRN
  Administered 2022-10-22 (×2): 500 mL

## 2022-10-22 MED ORDER — SODIUM CHLORIDE 0.9% FLUSH: 3.0000 mL | INTRAVENOUS | Status: DC | PRN

## 2022-10-22 MED ORDER — NITROGLYCERIN 1 MG/10 ML FOR IR/CATH LAB
INTRA_ARTERIAL | Status: DC | PRN
  Administered 2022-10-22: 100 ug via INTRACORONARY

## 2022-10-22 MED ORDER — ONDANSETRON HCL 4 MG/2ML IJ SOLN
4.0000 mg | Freq: Four times a day (QID) | INTRAMUSCULAR | Status: DC | PRN
  Administered 2022-10-25: 4 mg via INTRAVENOUS
  Filled 2022-10-22: qty 2

## 2022-10-22 MED ORDER — MIDAZOLAM HCL 2 MG/2ML IJ SOLN
INTRAMUSCULAR | Status: AC
  Filled 2022-10-22: qty 2

## 2022-10-22 MED ORDER — SODIUM CHLORIDE 0.9 % IV SOLN: 250.0000 mL | INTRAVENOUS | Status: DC | PRN

## 2022-10-22 MED ORDER — SODIUM CHLORIDE 0.9 % IV SOLN: INTRAVENOUS | Status: AC

## 2022-10-22 MED ORDER — EMPAGLIFLOZIN 10 MG PO TABS
10.0000 mg | ORAL_TABLET | Freq: Every day | ORAL | Status: DC
  Administered 2022-10-22 – 2022-10-26 (×5): 10 mg via ORAL
  Filled 2022-10-22 (×5): qty 1

## 2022-10-22 MED ORDER — LABETALOL HCL 5 MG/ML IV SOLN: 10.0000 mg | INTRAVENOUS | Status: AC | PRN

## 2022-10-22 MED ORDER — LIDOCAINE HCL (PF) 1 % IJ SOLN
INTRAMUSCULAR | Status: AC
  Filled 2022-10-22: qty 30

## 2022-10-22 MED ORDER — HEPARIN SODIUM (PORCINE) 5000 UNIT/ML IJ SOLN
5000.0000 [IU] | Freq: Three times a day (TID) | INTRAMUSCULAR | Status: DC
  Administered 2022-10-22 – 2022-10-26 (×10): 5000 [IU] via SUBCUTANEOUS
  Filled 2022-10-22 (×10): qty 1

## 2022-10-22 MED ORDER — ADENOSINE 12 MG/4ML IV SOLN
INTRAVENOUS | Status: AC
  Filled 2022-10-22: qty 4

## 2022-10-22 MED ORDER — EZETIMIBE 10 MG PO TABS
10.0000 mg | ORAL_TABLET | Freq: Every day | ORAL | Status: DC
  Administered 2022-10-22 – 2022-10-26 (×5): 10 mg via ORAL
  Filled 2022-10-22 (×5): qty 1

## 2022-10-22 MED ORDER — ADENOSINE 6 MG/2ML IV SOLN
INTRAVENOUS | Status: DC | PRN
  Administered 2022-10-22: 36 mg via INTRAVENOUS

## 2022-10-22 MED ORDER — FENTANYL CITRATE (PF) 100 MCG/2ML IJ SOLN
INTRAMUSCULAR | Status: AC
  Filled 2022-10-22: qty 2

## 2022-10-22 MED ORDER — ASPIRIN 81 MG PO CHEW
81.0000 mg | CHEWABLE_TABLET | ORAL | Status: AC
  Administered 2022-10-22: 81 mg via ORAL
  Filled 2022-10-22: qty 1

## 2022-10-22 MED ORDER — VERAPAMIL HCL 2.5 MG/ML IV SOLN
INTRAVENOUS | Status: DC | PRN
  Administered 2022-10-22: 10 mL via INTRA_ARTERIAL

## 2022-10-22 MED ORDER — IOHEXOL 350 MG/ML SOLN
INTRAVENOUS | Status: DC | PRN
  Administered 2022-10-22: 105 mL

## 2022-10-22 MED ORDER — LIDOCAINE HCL (PF) 1 % IJ SOLN
INTRAMUSCULAR | Status: DC | PRN
  Administered 2022-10-22: 5 mL

## 2022-10-22 MED ORDER — ACETAMINOPHEN 325 MG PO TABS
650.0000 mg | ORAL_TABLET | ORAL | Status: DC | PRN
  Administered 2022-10-23 – 2022-10-26 (×8): 650 mg via ORAL
  Filled 2022-10-22 (×8): qty 2

## 2022-10-22 MED ORDER — ADENOSINE 12 MG/4ML IV SOLN
INTRAVENOUS | Status: AC
  Filled 2022-10-22: qty 12

## 2022-10-22 MED ORDER — HEPARIN SODIUM (PORCINE) 1000 UNIT/ML IJ SOLN
INTRAMUSCULAR | Status: AC
  Filled 2022-10-22: qty 10

## 2022-10-22 MED ORDER — NITROGLYCERIN 1 MG/10 ML FOR IR/CATH LAB
INTRA_ARTERIAL | Status: AC
  Filled 2022-10-22: qty 10

## 2022-10-22 MED ORDER — POTASSIUM CHLORIDE CRYS ER 20 MEQ PO TBCR
40.0000 meq | EXTENDED_RELEASE_TABLET | Freq: Once | ORAL | Status: AC
  Administered 2022-10-22: 40 meq via ORAL
  Filled 2022-10-22: qty 2

## 2022-10-22 MED ORDER — ICOSAPENT ETHYL 1 G PO CAPS
2.0000 g | ORAL_CAPSULE | Freq: Two times a day (BID) | ORAL | Status: DC
  Administered 2022-10-22 – 2022-10-26 (×9): 2 g via ORAL
  Filled 2022-10-22 (×9): qty 2

## 2022-10-22 MED ORDER — HYDRALAZINE HCL 20 MG/ML IJ SOLN: 10.0000 mg | INTRAMUSCULAR | Status: AC | PRN

## 2022-10-22 MED ORDER — INSULIN GLARGINE-YFGN 100 UNIT/ML ~~LOC~~ SOLN
25.0000 [IU] | Freq: Every day | SUBCUTANEOUS | Status: DC
  Administered 2022-10-22: 25 [IU] via SUBCUTANEOUS
  Filled 2022-10-22 (×2): qty 0.25

## 2022-10-22 MED ORDER — SODIUM CHLORIDE 0.9% FLUSH
3.0000 mL | Freq: Two times a day (BID) | INTRAVENOUS | Status: DC
  Administered 2022-10-22 – 2022-10-26 (×8): 3 mL via INTRAVENOUS

## 2022-10-22 MED ORDER — INSULIN STARTER KIT- PEN NEEDLES (ENGLISH)
1.0000 | Freq: Once | Status: DC
  Filled 2022-10-22: qty 1

## 2022-10-22 MED ORDER — ASPIRIN 81 MG PO TBEC
81.0000 mg | DELAYED_RELEASE_TABLET | Freq: Every day | ORAL | Status: DC
  Administered 2022-10-23 – 2022-10-26 (×4): 81 mg via ORAL
  Filled 2022-10-22 (×4): qty 1

## 2022-10-22 MED ORDER — HEPARIN SODIUM (PORCINE) 1000 UNIT/ML IJ SOLN
INTRAMUSCULAR | Status: DC | PRN
  Administered 2022-10-22: 1000 [IU] via INTRAVENOUS
  Administered 2022-10-22: 3000 [IU] via INTRAVENOUS
  Administered 2022-10-22: 4500 [IU] via INTRAVENOUS

## 2022-10-22 MED ORDER — SODIUM CHLORIDE 0.9 % IV SOLN: INTRAVENOUS | Status: DC

## 2022-10-22 MED ORDER — VERAPAMIL HCL 2.5 MG/ML IV SOLN
INTRAVENOUS | Status: AC
  Filled 2022-10-22: qty 2

## 2022-10-22 MED ORDER — INSULIN ASPART 100 UNIT/ML IJ SOLN
5.0000 [IU] | Freq: Three times a day (TID) | INTRAMUSCULAR | Status: DC
  Administered 2022-10-22 – 2022-10-24 (×5): 5 [IU] via SUBCUTANEOUS

## 2022-10-22 MED ORDER — POTASSIUM CHLORIDE 10 MEQ/100ML IV SOLN
10.0000 meq | INTRAVENOUS | Status: AC
  Administered 2022-10-22 (×4): 10 meq via INTRAVENOUS
  Filled 2022-10-22 (×4): qty 100

## 2022-10-22 SURGICAL SUPPLY — 14 items
CATH 5FR JL3.5 JR4 ANG PIG MP (CATHETERS) IMPLANT
CATH INFINITI AMBI 5FR TG (CATHETERS) IMPLANT
CATH LAUNCHER 6FR EBU3.5 (CATHETERS) IMPLANT
DEVICE RAD COMP TR BAND LRG (VASCULAR PRODUCTS) IMPLANT
GLIDESHEATH SLEND A-KIT 6F 22G (SHEATH) IMPLANT
GUIDEWIRE INQWIRE 1.5J.035X260 (WIRE) IMPLANT
GUIDEWIRE PRESSURE X 175 (WIRE) IMPLANT
INQWIRE 1.5J .035X260CM (WIRE) ×1
KIT HEMO VALVE WATCHDOG (MISCELLANEOUS) IMPLANT
KIT SYRINGE INJ CVI SPIKEX1 (MISCELLANEOUS) IMPLANT
PACK CARDIAC CATHETERIZATION (CUSTOM PROCEDURE TRAY) ×1 IMPLANT
SET ATX-X65L (MISCELLANEOUS) IMPLANT
SHEATH PROBE COVER 6X72 (BAG) IMPLANT
TUBING CIL FLEX 10 FLL-RA (TUBING) IMPLANT

## 2022-10-22 NOTE — Progress Notes (Addendum)
PROGRESS NOTE    Scott BLANCARTE  Pineda:[id number] DOB: 1960-08-04 DOA: 10/21/2022 PCP: [facility]    Brief Narrative:   Scott Pineda is a 62 y.o. male with past medical history significant for  insulin-dependent T2DM, HTN, HLD, asthma, remote history of sarcoidosis in remission, depression/anxiety who presented to the ED for evaluation of chest pain.  Patient states that he has been having chest pain on and off for the last 2 months.  Pain is described as sharp and sometimes pressure-like sensation.  It occurs in the midsternal to left chest area with radiation down his left arm and up his neck.  It may occur with rest.  Sometimes it is associated with dyspnea and diaphoresis. Patient states that this morning chest pain returned and was very severe.  He also reports cough with deep inspiration.  He denies any known personal cardiac history.  He is adopted and does not know his biological family history.  He is a never smoker but states that he was exposed to smoke in the house growing up.   He underwent nuclear stress testing recently 10/07/2022 in [place] which did not show reversible ischemia or infarction.  Normal LV wall motion, EF 60%.   Patient reports severe intolerance to statin which caused significant myopathy about 12 years ago.  He is not currently on aspirin.  Blood sugars have been poorly controlled recently.  He states that he stopped taking metformin due to GI intolerance and is now on insulin glargine 25 units every evening in addition to glipizide and [place].   In the ED, BP 159/104, pulse 110, RR 20, temp 97.8 F, SpO2 97% on room air. Sodium 130 (139 when corrected for hyperglycemia), potassium 3.9, bicarb 20, BUN 9, creatinine 0.81, serum glucose 479, WBC 7.7, hemoglobin 16.1, platelets 221,000, troponin 7 > 8, beta hydroxybutyrate 1.40. CTA chest negative for PE or acute abnormality.  Aortic atherosclerosis noted. Patient was given 10 units subcutaneous NovoLog, 1 L  normal saline, IV morphine 4 mg x 2.  EDP spoke with on-call [facility] cardiology Dr. Odis Hollingshead who recommended starting on IV heparin, medical admission, and potential cardiac cath in the morning.  Patient was started on IV nitroglycerin infusion.  The hospitalist service was consulted to admit for further evaluation and management.  Assessment & Plan:   Unstable angina Patient presenting with continued chest discomfort, ongoing for 2 months.  Localized to his substernal region with radiation to his left arm/jaw.  He has had recent normal stress test at OSH.  Troponin is negative x 2 and EKG without acute ischemic changes.   -- Cardiology following, appreciate assistance --TTE: Pending -- Nitroglycerin drip -- Heparin drip -- Aspirin 81 mg p.o. daily -- Allergy to statin, started on Vascepa -- N.p.o., plan heart catheterization today -- Continue monitor on telemetry  Hypokalemia Potassium 3.1, will replete.  Will check magnesium level -- Repeat electrolytes in a.m. to include magnesium  Type 2 diabetes mellitus with hyperglycemia, with long-term current use of insulin  Hemoglobin A1c 13.3, poorly controlled.  Recently stopped metformin due to GI side effect.  Home regimen includes Semglee 25 units subcutaneously nightly, glipizide 5 mg p.o. daily and Farxiga. - -Diabetic educator consult -- Semglee 25u [place] daily -- Novolog 5 u TIDAC -- SSI for coverage -- CBGs qAC/HS   Hypertension associated with diabetes  -- Continue lisinopril 20 mg daily.   Hyperlipidemia associated with type 2 diabetes mellitus  Not on statin due to reported severe intolerance.  Lipid panel with total cholesterol 227, HDL 30, LDL 128, triglycerides 345. -- Lipoprotein a: Pending -- Vascepa 2 g p.o. twice daily   Asthma --Albuterol neb every 6 hours as needed wheezing/shortness of breath   Depression -- Continue Zoloft.   DVT prophylaxis: Heparin drip    Code Status: Full Code Family Communication: Updated  spouse present at bedside this morning  Disposition Plan:  Level of care: Progressive Status is: Inpatient Remains inpatient appropriate because: Pending heart catheterization, IV heparin/nitroglycerin drip    Consultants:  Cardiology Odis Hollingshead)  Procedures:  TTE: Pending Heart catheterization: Pending  Antimicrobials:  None   Subjective: Patient seen examined bedside, lying in bed.  Remains in ED holding area.  Spouse present at bedside.  Continues with intermittent chest pain overnight, currently none reported.  Seen by cardiology, started on heparin and nitroglycerin drip.  Pending heart catheterization later today.  Reports had severe allergy to statins in the past causing him to be "unable to walk".  Denies tobacco or alcohol use.  No other specific questions or concerns at this time.  Denies headache, no visual changes, no current chest pain, no palpitations, no shortness of breath, no abdominal pain, no fever/chills/night sweats, no nausea/vomiting/diarrhea, no focal weakness, no fatigue, no paresthesias.  No acute events overnight per nursing staff.  Objective: Vitals:   10/22/22 0735 10/22/22 0800 10/22/22 0809 10/22/22 0843  BP: 105/64 124/82 112/66 128/84  Pulse: 74 73 72 79  Resp: 14 13 14 19   Temp: 97.6 F (36.4 C)     TempSrc: Oral     SpO2: 100% 99% 100% 100%  Weight:      Height:        Intake/Output Summary (Last 24 hours) at 10/22/2022 0930 Last data filed at 10/22/2022 0544 Gross per 24 hour  Intake 1201.41 ml  Output --  Net 1201.41 ml   Filed Weights   10/21/22 2036  Weight: 86.4 kg    Examination:  Physical Exam: GEN: NAD, alert and oriented x 3, wd/wn HEENT: NCAT, PERRL, EOMI, sclera clear, MMM PULM: CTAB w/o wheezes/crackles, normal respiratory effort, on room air CV: RRR w/o M/G/R GI: abd soft, NTND, NABS, no R/G/M MSK: no peripheral edema, muscle strength globally intact 5/5 bilateral upper/lower extremities NEURO: CN II-XII intact, no focal  deficits, sensation to light touch intact PSYCH: normal mood/affect Integumentary: dry/intact, no rashes or wounds    Data Reviewed: I have personally reviewed following labs and imaging studies  CBC: Recent Labs  Lab 10/21/22 1349 10/22/22 0330  WBC 7.7 7.4  HGB 16.1 13.5  HCT 45.5 39.7  MCV 88.0 87.4  PLT 221 183   Basic Metabolic Panel: Recent Labs  Lab 10/21/22 1349 10/22/22 0330  NA 130* 135  K 3.9 3.1*  CL 95* 100  CO2 20* 21*  GLUCOSE 479* 274*  BUN 9 7*  CREATININE 0.81 0.78  CALCIUM 9.3 8.1*  MG  --  1.9   GFR: Estimated Creatinine Clearance: 105.1 mL/min (by C-G formula based on SCr of 0.78 mg/dL). Liver Function Tests: No results for input(s): "AST", "ALT", "ALKPHOS", "BILITOT", "PROT", "ALBUMIN" in the last 168 hours. No results for input(s): "LIPASE", "AMYLASE" in the last 168 hours. No results for input(s): "AMMONIA" in the last 168 hours. Coagulation Profile: No results for input(s): "INR", "PROTIME" in the last 168 hours. Cardiac Enzymes: No results for input(s): "CKTOTAL", "CKMB", "CKMBINDEX", "TROPONINI" in the last 168 hours. BNP (last 3 results) No results for input(s): "PROBNP" in the last  8760 hours. HbA1C: Recent Labs    10/22/22 0330  HGBA1C 13.3*   CBG: Recent Labs  Lab 10/21/22 1349 10/21/22 1607 10/21/22 2208 10/22/22 0017 10/22/22 0743  GLUCAP 523* 386* 353* 303* 327*   Lipid Profile: Recent Labs    10/22/22 0330  CHOL 227*  HDL 30*  LDLCALC 128*  TRIG 345*  CHOLHDL 7.6   Thyroid Function Tests: No results for input(s): "TSH", "T4TOTAL", "FREET4", "T3FREE", "THYROIDAB" in the last 72 hours. Anemia Panel: No results for input(s): "VITAMINB12", "FOLATE", "FERRITIN", "TIBC", "IRON", "RETICCTPCT" in the last 72 hours. Sepsis Labs: No results for input(s): "PROCALCITON", "LATICACIDVEN" in the last 168 hours.  No results found for this or any previous visit (from the past 240 hour(s)).       Radiology  Studies: CT Angio Chest PE W/Cm &/Or Wo Cm  Result Date: 10/21/2022 CLINICAL DATA:  Shortness of breath.  PE suspected EXAM: CT ANGIOGRAPHY CHEST WITH CONTRAST TECHNIQUE: Multidetector CT imaging of the chest was performed using the standard protocol during bolus administration of intravenous contrast. Multiplanar CT image reconstructions and MIPs were obtained to evaluate the vascular anatomy. RADIATION DOSE REDUCTION: This exam was performed according to the departmental dose-optimization program which includes automated exposure control, adjustment of the mA and/or kV according to patient size and/or use of iterative reconstruction technique. CONTRAST:  75mL OMNIPAQUE IOHEXOL 350 MG/ML SOLN COMPARISON:  Chest radiograph 10/21/2022 and CT chest report from 08/24/2020 FINDINGS: Cardiovascular: Negative for acute pulmonary embolism. Normal caliber thoracic aorta. No pericardial effusion. Aortic and coronary artery atherosclerotic calcification. Mediastinum/Nodes: Trachea and esophagus are unremarkable. No thoracic adenopathy Lungs/Pleura: No focal consolidation, pleural effusion, or pneumothorax. Upper Abdomen: No acute abnormality. Musculoskeletal: No acute fracture. Review of the MIP images confirms the above findings. IMPRESSION: Negative for acute pulmonary embolism. No acute abnormality in the chest. Aortic Atherosclerosis (ICD10-I70.0). Electronically Signed   By: Minerva Fester M.D.   On: 10/21/2022 18:14   DG Chest 2 View  Result Date: 10/21/2022 CLINICAL DATA:  Major chest pain that radiates into the left arm and neck EXAM: CHEST - 2 VIEW COMPARISON:  X-ray 09/03/2022 FINDINGS: The heart size and mediastinal contours are within normal limits. No consolidation, pneumothorax or effusion. No edema. The visualized skeletal structures are unremarkable. Mild pectus excavatum on lateral view. Overlapping cardiac leads. IMPRESSION: No acute cardiopulmonary disease. Electronically Signed   By: Karen Kays  M.D.   On: 10/21/2022 17:58        Scheduled Meds:  aspirin  81 mg Oral Pre-Cath   [START ON 10/23/2022] aspirin EC  81 mg Oral Daily   icosapent Ethyl  2 g Oral BID   insulin aspart  0-15 Units Subcutaneous TID WC   insulin aspart  0-5 Units Subcutaneous QHS   insulin glargine-yfgn  10 Units Subcutaneous QHS   lisinopril  20 mg Oral Daily   potassium chloride  40 mEq Oral Once   sertraline  150 mg Oral Daily   sodium chloride flush  3 mL Intravenous Q12H   Continuous Infusions:  sodium chloride 50 mL/hr at 10/22/22 0800   sodium chloride     heparin 1,400 Units/hr (10/22/22 0715)   nitroGLYCERIN 30 mcg/min (10/22/22 0844)   potassium chloride 10 mEq (10/22/22 0859)     LOS: 1 day    Time spent: 53 minutes spent on chart review, discussion with nursing staff, consultants, updating family and interview/physical exam; more than 50% of that time was spent in counseling and/or coordination of care.  Alvira Philips [place], DO [facility] Available via Epic secure chat 7am-7pm After these hours, please refer to coverage provider listed on [email or web address] 10/22/2022, 9:30 AM

## 2022-10-22 NOTE — Plan of Care (Signed)
  Problem: Skin Integrity: Goal: Risk for impaired skin integrity will decrease Outcome: Progressing   Problem: Activity: Goal: Ability to return to baseline activity level will improve Outcome: Progressing   Problem: Cardiovascular: Goal: Vascular access site(s) Level 0-1 will be maintained Outcome: Progressing   Problem: Activity: Goal: Risk for activity intolerance will decrease Outcome: Progressing   Problem: Nutrition: Goal: Adequate nutrition will be maintained Outcome: Progressing

## 2022-10-22 NOTE — Interval H&P Note (Signed)
History and Physical Interval Note:  10/22/2022 9:57 AM  Scott Pineda  has presented today for surgery, with the diagnosis of unstable angina.  The various methods of treatment have been discussed with the patient and family. After consideration of risks, benefits and other options for treatment, the patient has consented to  Procedure(s): LEFT HEART CATH AND CORONARY ANGIOGRAPHY (N/A) as a surgical intervention.  The patient's history has been reviewed, patient examined, no change in status, stable for surgery.  I have reviewed the patient's chart and labs.  Questions were answered to the patient's satisfaction.     [name]

## 2022-10-22 NOTE — ED Notes (Addendum)
Per attending physician, Dr. [place], hold all oral medications until after patient's cardiac catheterization today.  8:33 AM Per attending physician, Dr. [place], okay to give scheduled aspirin, zoloft, and lisinopril this morning.

## 2022-10-22 NOTE — Progress Notes (Signed)
Echocardiogram 2D Echocardiogram has been performed.  Lucendia Herrlich 10/22/2022, 4:06 PM

## 2022-10-22 NOTE — Consult Note (Signed)
CARDIOLOGY CONSULT NOTE  Patient ID: Scott Pineda MRN: [id number] DOB/AGE: [birth year] 62 y.o.  Admit date: 10/21/2022 Attending physician: [place], Eric J, DO Primary Physician:  [facility] Provider: None Inpatient Cardiologist: Tessa Lerner, DO, [facility]  Reason of consultation: Chest pain Referring physician: Dr. Silverio Lay  Chief complaint: Chest pain  HPI:  TAYSHAWN BREINING is a 62 y.o. Caucasian male who presents with a chief complaint of " chest pain." His past medical history and cardiovascular risk factors include: IDDM Type II, Hyperlipidemia, Hypertension, hypertriglyceridemia.  Patient seen and evaluated in ER bed 9.  Accompanied by his wife at bedside.  Patient presents to the hospital with a chief complaint of chest pain: Ongoing for the last 2 months. Progressive. Intensity on arrival 12 out of 10, currently on nitro drip and IV heparin and the pain is still 2 out of 10. Describes it as tightness. Radiates to the left arm. Worse with exertion. Better with resting. Had a nuclear stress test at [facility] which was reported to be low risk-patient was unaware of the results. He was referred to Dr. Carollee Herter for further evaluation and had an appointment scheduled in October 2024.  Patient unable to provide family history as he is adopted. He is a [employer] and because of shift work may not be taking his insulin as directed, per his wife.   ALLERGIES: Allergies  Allergen Reactions   Glucophage Xr [Metformin] Other (See Comments)    Abdominal/stomach pain   Motrin [Ibuprofen] Other (See Comments)    Unknown reaction   Statins Other (See Comments)    Myalgias  Weakness of significance     PAST MEDICAL HISTORY: Past Medical History:  Diagnosis Date   ADVERSE REACTION TO MEDICATION 10/09/2008   Qualifier: Diagnosis of  By: Fabian Sharp MD, Neta Mends    Allergic rhinitis    Asthma    Depression    GERD (gastroesophageal reflux disease)    Gout     Hyperlipidemia    Hypertension    Salivary stone    Trauma    hx of falling trauma and back pain with side effects of medication, parkisonian now resolved    PAST SURGICAL HISTORY: Past Surgical History:  Procedure Laterality Date   APPENDECTOMY     CATARACT EXTRACTION     left 2009 and right 2006   CHOLECYSTECTOMY     HERNIA REPAIR     salivary stone extracted     TONSILLECTOMY      FAMILY HISTORY: The patient's He was adopted. Family history is unknown by patient.   SOCIAL HISTORY:  The patient  reports that he has never smoked. He has never used smokeless tobacco. He reports that he does not drink alcohol and does not use drugs.  MEDICATIONS: Current Outpatient Medications  Medication Instructions   dapagliflozin propanediol (FARXIGA) 5 mg, Oral, Daily   diphenhydramine-acetaminophen (TYLENOL PM) 25-500 MG TABS tablet 3 tablets, Oral, Daily at bedtime   glipiZIDE (GLUCOTROL XL) 5 mg, Oral, Daily after supper   lisinopril (ZESTRIL) 20 mg, Oral, Daily   Semglee (yfgn) 25 Units, Subcutaneous, Every evening   sertraline (ZOLOFT) 100 MG tablet TAKE 1 TABLET (100 MG TOTAL) BY MOUTH DAILY.    REVIEW OF SYSTEMS: Review of Systems  Cardiovascular:  Positive for chest pain and dyspnea on exertion. Negative for claudication, irregular heartbeat, leg swelling, near-syncope, orthopnea, palpitations, paroxysmal nocturnal dyspnea and syncope.  Respiratory:  Negative for shortness of breath.   Hematologic/Lymphatic: Negative for bleeding  problem.  Musculoskeletal:  Negative for muscle cramps and myalgias.  Neurological:  Positive for light-headedness. Negative for dizziness.    PHYSICAL EXAMINATION: PHYSICAL EXAM: Temp:  [97.5 F (36.4 C)-98.1 F (36.7 C)] 97.6 F (36.4 C) (09/04 0735) Pulse Rate:  [70-110] 79 (09/04 0843) Resp:  [9-24] 19 (09/04 0843) BP: (105-161)/(54-104) 128/84 (09/04 0843) SpO2:  [93 %-100 %] 100 % (09/04 0843) Weight:  [86.4 kg] 86.4 kg (09/03  2036)  Intake/Output:  Intake/Output Summary (Last 24 hours) at 10/22/2022 0851 Last data filed at 10/22/2022 0544 Gross per 24 hour  Intake 1201.41 ml  Output --  Net 1201.41 ml     Net IO Since Admission: 1,201.41 mL [10/22/22 0851]  Weights:     10/21/2022    8:36 PM 07/28/2016   11:34 AM 05/02/2016    3:24 PM  Last 3 Weights  Weight (lbs) 190 lb 7.6 oz 216 lb 217 lb 9.6 oz  Weight (kg) 86.4 kg 97.977 kg 98.703 kg     Physical Exam  Constitutional: No distress.  Age appropriate, hemodynamically stable., hemodynamically stable.   Neck: No JVD present.  Cardiovascular: Normal rate, regular rhythm, S1 normal, S2 normal, intact distal pulses and normal pulses. Exam reveals no gallop, no S3 and no S4.  No murmur heard. Pulmonary/Chest: Effort normal and breath sounds normal. No stridor. He has no wheezes. He has no rales.  Abdominal: Soft. Bowel sounds are normal. He exhibits no distension. There is no abdominal tenderness.  Musculoskeletal:        General: No edema.     Cervical back: Neck supple.  Neurological: He is alert and oriented to person, place, and time. He has intact cranial nerves (2-12).  Skin: Skin is warm and moist.    LAB RESULTS: Chemistry Recent Labs  Lab 10/21/22 1349 10/22/22 0330  NA 130* 135  K 3.9 3.1*  CL 95* 100  CO2 20* 21*  GLUCOSE 479* 274*  BUN 9 7*  CREATININE 0.81 0.78  CALCIUM 9.3 8.1*  GFRNONAA >60 >60  ANIONGAP 15 14    Hematology Recent Labs  Lab 10/21/22 1349 10/22/22 0330  WBC 7.7 7.4  RBC 5.17 4.54  HGB 16.1 13.5  HCT 45.5 39.7  MCV 88.0 87.4  MCH 31.1 29.7  MCHC 35.4 34.0  RDW 12.8 13.0  PLT 221 183   High Sensitivity Troponin:   Recent Labs  Lab 10/21/22 1349 10/21/22 1553  TROPONINIHS 7 8     Cardiac EnzymesNo results for input(s): "TROPONINI" in the last 168 hours. No results for input(s): "TROPIPOC" in the last 168 hours.  BNPNo results for input(s): "BNP", "PROBNP" in the last 168 hours.  DDimer No results for input(s):  "DDIMER" in the last 168 hours.  Hemoglobin A1c:  Lab Results  Component Value Date   HGBA1C 13.3 (H) 10/22/2022   MPG 335.01 10/22/2022   TSH No results for input(s): "TSH" in the last 8760 hours. Lipid Panel  Lab Results  Component Value Date   CHOL 227 (H) 10/22/2022   HDL 30 (L) 10/22/2022   LDLCALC 128 (H) 10/22/2022   LDLDIRECT 142.0 05/28/2015   TRIG 345 (H) 10/22/2022   CHOLHDL 7.6 10/22/2022   Drugs of Abuse  No results found for: "LABOPIA", "COCAINSCRNUR", "LABBENZ", "AMPHETMU", "THCU", "LABBARB"    CARDIAC DATABASE: EKG: 10/21/2022: Sinus tachycardia, 105 bpm, left axis, without underlying ischemia injury pattern.  09/20/2022: Sinus rhythm, 87 bpm, without underlying ischemia injury pattern.  Echocardiogram: Ordered   Stress Testing:  10/07/2022  1. No reversible ischemia or infarction.   2. Normal left ventricular wall motion.   3. Left ventricular ejection fraction 60%   4. Non invasive risk stratification*: Low   Heart Catheterization: Later today.   Scheduled Meds:  aspirin  81 mg Oral Pre-Cath   aspirin EC  81 mg Oral Daily   insulin aspart  0-15 Units Subcutaneous TID WC   insulin aspart  0-5 Units Subcutaneous QHS   insulin glargine-yfgn  10 Units Subcutaneous QHS   lisinopril  20 mg Oral Daily   potassium chloride  40 mEq Oral Once   sertraline  150 mg Oral Daily   sodium chloride flush  3 mL Intravenous Q12H    Continuous Infusions:  sodium chloride 50 mL/hr at 10/22/22 0800   sodium chloride     heparin 1,400 Units/hr (10/22/22 0715)   nitroGLYCERIN 30 mcg/min (10/22/22 0844)   potassium chloride 10 mEq (10/22/22 0804)    PRN Meds: acetaminophen **OR** acetaminophen, albuterol, ondansetron **OR** ondansetron (ZOFRAN) IV, senna-docusate  IMPRESSION & RECOMMENDATIONS: KAIYAN LAING is a 62 y.o. Caucasian male whose past medical history and cardiovascular risk factors include:  IDDM Type II, Hyperlipidemia, Hypertension,  hypertriglyceridemia..  Impression:  Unstable angina Uncontrolled insulin-dependent diabetes mellitus type 2. Hypertension. Hypertriglyceridemia. Statin intolerance-per patient  Plan:  Patient been experiencing chest pain likely cardiac discomfort for the last 2 months and is becoming more progressive.  Outpatient workup included nuclear stress test which was reported to be low risk.  He had an outpatient appointment scheduled in October 2024 with Dr. Rennis Golden to establish care.  Presents to [facility] due to progressive chest pain suggestive of angina pectoris.  EKG did not meet STEMI criteria and in fact does not show ischemic pattern.  High sensitive troponins are negative x 2.  However based on the story I was concerned for unstable angina and recommended IV heparin drip overnight.  He was also started on IV nitro drip for pain control as he reported initial chest pain of 12 out of 10.  He has multiple cardiovascular risk factors as outlined above and the overall pretest probability of obstructive CAD is high.  Due to ongoing pain despite being on IV heparin and nitroglycerin drip shared decision was to proceed with left heart catheterization with possible intervention.  The procedure of left heart catheterization with possible intervention was explained to the patient and his wife in detail.  The indication, alternatives, risks and benefits were reviewed.  Complications include but not limited to bleeding, infection, vascular injury, stroke, myocardial infarction, arrhythmia (requiring medical or cardiopulmonary resuscitation), kidney injury (requiring short-term or long-term hemodialysis), radiation-related injury in the case of prolonged fluoroscopy use, emergent cardiac surgery, temporary or permanent pacemaker, and death. The patient and wife voices understanding and provides verbal feedback their questions and concerns are addressed to their satisfaction and patient wishes to  proceed with coronary angiography with possible PCI.  Patient endorses statin intolerance-will have to discuss further after heart catheterization to see which pharmacological agents he has prior and consider an alternative lower is in the hospital.  As outpatient he can be considered for PCSK9 inhibitors or Leqvio.  Start Vascepa given his hypertriglyceridemia.  Medication profile discussed.  Diabetes management per primary team.   Total encounter time 60 minutes. *Total Encounter Time as defined by the Centers for Medicare and Medicaid Services includes, in addition to the face-to-face time of a patient visit (documented in the note above) non-face-to-face time: obtaining and reviewing outside  history, ordering and reviewing medications, tests or procedures, care coordination (communications with other health care professionals or caregivers) and documentation in the medical record.  Patient's questions and concerns were addressed to his satisfaction. He voices understanding of the instructions provided during this encounter.   This note was created using a voice recognition software as a result there may be grammatical errors inadvertently enclosed that do not reflect the nature of this encounter. Every attempt is made to correct such errors.  Delilah Shan [facility]  Pager:  [phone] Office: [phone] 10/22/2022, 8:51 AM

## 2022-10-22 NOTE — H&P (View-Only) (Signed)
CARDIOLOGY CONSULT NOTE  Patient ID: Scott Pineda MRN: [id number] DOB/AGE: [birth year] 62 y.o.  Admit date: 10/21/2022 Attending physician: [place], Eric J, DO Primary Physician:  [facility] Provider: None Inpatient Cardiologist: Tessa Lerner, DO, [facility]  Reason of consultation: Chest pain Referring physician: Dr. Silverio Lay  Chief complaint: Chest pain  HPI:  Scott Pineda is a 62 y.o. Caucasian male who presents with a chief complaint of " chest pain." His past medical history and cardiovascular risk factors include: IDDM Type II, Hyperlipidemia, Hypertension, hypertriglyceridemia.  Patient seen and evaluated in ER bed 9.  Accompanied by his wife at bedside.  Patient presents to the hospital with a chief complaint of chest pain: Ongoing for the last 2 months. Progressive. Intensity on arrival 12 out of 10, currently on nitro drip and IV heparin and the pain is still 2 out of 10. Describes it as tightness. Radiates to the left arm. Worse with exertion. Better with resting. Had a nuclear stress test at [facility] which was reported to be low risk-patient was unaware of the results. He was referred to Dr. Carollee Herter for further evaluation and had an appointment scheduled in October 2024.  Patient unable to provide family history as he is adopted. He is a [employer] and because of shift work may not be taking his insulin as directed, per his wife.   ALLERGIES: Allergies  Allergen Reactions   Glucophage Xr [Metformin] Other (See Comments)    Abdominal/stomach pain   Motrin [Ibuprofen] Other (See Comments)    Unknown reaction   Statins Other (See Comments)    Myalgias  Weakness of significance     PAST MEDICAL HISTORY: Past Medical History:  Diagnosis Date   ADVERSE REACTION TO MEDICATION 10/09/2008   Qualifier: Diagnosis of  By: Fabian Sharp MD, Neta Mends    Allergic rhinitis    Asthma    Depression    GERD (gastroesophageal reflux disease)    Gout     Hyperlipidemia    Hypertension    Salivary stone    Trauma    hx of falling trauma and back pain with side effects of medication, parkisonian now resolved    PAST SURGICAL HISTORY: Past Surgical History:  Procedure Laterality Date   APPENDECTOMY     CATARACT EXTRACTION     left 2009 and right 2006   CHOLECYSTECTOMY     HERNIA REPAIR     salivary stone extracted     TONSILLECTOMY      FAMILY HISTORY: The patient's He was adopted. Family history is unknown by patient.   SOCIAL HISTORY:  The patient  reports that he has never smoked. He has never used smokeless tobacco. He reports that he does not drink alcohol and does not use drugs.  MEDICATIONS: Current Outpatient Medications  Medication Instructions   dapagliflozin propanediol (FARXIGA) 5 mg, Oral, Daily   diphenhydramine-acetaminophen (TYLENOL PM) 25-500 MG TABS tablet 3 tablets, Oral, Daily at bedtime   glipiZIDE (GLUCOTROL XL) 5 mg, Oral, Daily after supper   lisinopril (ZESTRIL) 20 mg, Oral, Daily   Semglee (yfgn) 25 Units, Subcutaneous, Every evening   sertraline (ZOLOFT) 100 MG tablet TAKE 1 TABLET (100 MG TOTAL) BY MOUTH DAILY.    REVIEW OF SYSTEMS: Review of Systems  Cardiovascular:  Positive for chest pain and dyspnea on exertion. Negative for claudication, irregular heartbeat, leg swelling, near-syncope, orthopnea, palpitations, paroxysmal nocturnal dyspnea and syncope.  Respiratory:  Negative for shortness of breath.   Hematologic/Lymphatic: Negative for bleeding  problem.  Musculoskeletal:  Negative for muscle cramps and myalgias.  Neurological:  Positive for light-headedness. Negative for dizziness.    PHYSICAL EXAMINATION: PHYSICAL EXAM: Temp:  [97.5 F (36.4 C)-98.1 F (36.7 C)] 97.6 F (36.4 C) (09/04 0735) Pulse Rate:  [70-110] 79 (09/04 0843) Resp:  [9-24] 19 (09/04 0843) BP: (105-161)/(54-104) 128/84 (09/04 0843) SpO2:  [93 %-100 %] 100 % (09/04 0843) Weight:  [86.4 kg] 86.4 kg (09/03  2036)  Intake/Output:  Intake/Output Summary (Last 24 hours) at 10/22/2022 0851 Last data filed at 10/22/2022 0544 Gross per 24 hour  Intake 1201.41 ml  Output --  Net 1201.41 ml     Net IO Since Admission: 1,201.41 mL [10/22/22 0851]  Weights:     10/21/2022    8:36 PM 07/28/2016   11:34 AM 05/02/2016    3:24 PM  Last 3 Weights  Weight (lbs) 190 lb 7.6 oz 216 lb 217 lb 9.6 oz  Weight (kg) 86.4 kg 97.977 kg 98.703 kg     Physical Exam  Constitutional: No distress.  Age appropriate, hemodynamically stable.   Neck: No JVD present.  Cardiovascular: Normal rate, regular rhythm, S1 normal, S2 normal, intact distal pulses and normal pulses. Exam reveals no gallop, no S3 and no S4.  No murmur heard. Pulmonary/Chest: Effort normal and breath sounds normal. No stridor. He has no wheezes. He has no rales.  Abdominal: Soft. Bowel sounds are normal. He exhibits no distension. There is no abdominal tenderness.  Musculoskeletal:        General: No edema.     Cervical back: Neck supple.  Neurological: He is alert and oriented to person, place, and time. He has intact cranial nerves (2-12).  Skin: Skin is warm and moist.    LAB RESULTS: Chemistry Recent Labs  Lab 10/21/22 1349 10/22/22 0330  NA 130* 135  K 3.9 3.1*  CL 95* 100  CO2 20* 21*  GLUCOSE 479* 274*  BUN 9 7*  CREATININE 0.81 0.78  CALCIUM 9.3 8.1*  GFRNONAA >60 >60  ANIONGAP 15 14    Hematology Recent Labs  Lab 10/21/22 1349 10/22/22 0330  WBC 7.7 7.4  RBC 5.17 4.54  HGB 16.1 13.5  HCT 45.5 39.7  MCV 88.0 87.4  MCH 31.1 29.7  MCHC 35.4 34.0  RDW 12.8 13.0  PLT 221 183   High Sensitivity Troponin:   Recent Labs  Lab 10/21/22 1349 10/21/22 1553  TROPONINIHS 7 8     Cardiac EnzymesNo results for input(s): "TROPONINI" in the last 168 hours. No results for input(s): "TROPIPOC" in the last 168 hours.  BNPNo results for input(s): "BNP", "PROBNP" in the last 168 hours.  DDimer No results for input(s):  "DDIMER" in the last 168 hours.  Hemoglobin A1c:  Lab Results  Component Value Date   HGBA1C 13.3 (H) 10/22/2022   MPG 335.01 10/22/2022   TSH No results for input(s): "TSH" in the last 8760 hours. Lipid Panel  Lab Results  Component Value Date   CHOL 227 (H) 10/22/2022   HDL 30 (L) 10/22/2022   LDLCALC 128 (H) 10/22/2022   LDLDIRECT 142.0 05/28/2015   TRIG 345 (H) 10/22/2022   CHOLHDL 7.6 10/22/2022   Drugs of Abuse  No results found for: "LABOPIA", "COCAINSCRNUR", "LABBENZ", "AMPHETMU", "THCU", "LABBARB"    CARDIAC DATABASE: EKG: 10/21/2022: Sinus tachycardia, 105 bpm, left axis, without underlying ischemia injury pattern.  09/20/2022: Sinus rhythm, 87 bpm, without underlying ischemia injury pattern.  Echocardiogram: Ordered   Stress Testing:  10/07/2022  1. No reversible ischemia or infarction.   2. Normal left ventricular wall motion.   3. Left ventricular ejection fraction 60%   4. Non invasive risk stratification*: Low   Heart Catheterization: Later today.   Scheduled Meds:  aspirin  81 mg Oral Pre-Cath   aspirin EC  81 mg Oral Daily   insulin aspart  0-15 Units Subcutaneous TID WC   insulin aspart  0-5 Units Subcutaneous QHS   insulin glargine-yfgn  10 Units Subcutaneous QHS   lisinopril  20 mg Oral Daily   potassium chloride  40 mEq Oral Once   sertraline  150 mg Oral Daily   sodium chloride flush  3 mL Intravenous Q12H    Continuous Infusions:  sodium chloride 50 mL/hr at 10/22/22 0800   sodium chloride     heparin 1,400 Units/hr (10/22/22 0715)   nitroGLYCERIN 30 mcg/min (10/22/22 0844)   potassium chloride 10 mEq (10/22/22 0804)    PRN Meds: acetaminophen **OR** acetaminophen, albuterol, ondansetron **OR** ondansetron (ZOFRAN) IV, senna-docusate  IMPRESSION & RECOMMENDATIONS: Scott Pineda is a 62 y.o. Caucasian male whose past medical history and cardiovascular risk factors include:  IDDM Type II, Hyperlipidemia, Hypertension,  hypertriglyceridemia..  Impression:  Unstable angina Uncontrolled insulin-dependent diabetes mellitus type 2. Hypertension. Hypertriglyceridemia. Statin intolerance-per patient  Plan:  Patient been experiencing chest pain likely cardiac discomfort for the last 2 months and is becoming more progressive.  Outpatient workup included nuclear stress test which was reported to be low risk.  He had an outpatient appointment scheduled in October 2024 with Dr. Rennis Golden to establish care.  Presents to [facility] due to progressive chest pain suggestive of angina pectoris.  EKG did not meet STEMI criteria and in fact does not show ischemic pattern.  High sensitive troponins are negative x 2.  However based on the story I was concerned for unstable angina and recommended IV heparin drip overnight.  He was also started on IV nitro drip for pain control as he reported initial chest pain of 12 out of 10.  He has multiple cardiovascular risk factors as outlined above and the overall pretest probability of obstructive CAD is high.  Due to ongoing pain despite being on IV heparin and nitroglycerin drip shared decision was to proceed with left heart catheterization with possible intervention.  The procedure of left heart catheterization with possible intervention was explained to the patient and his wife in detail.  The indication, alternatives, risks and benefits were reviewed.  Complications include but not limited to bleeding, infection, vascular injury, stroke, myocardial infarction, arrhythmia (requiring medical or cardiopulmonary resuscitation), kidney injury (requiring short-term or long-term hemodialysis), radiation-related injury in the case of prolonged fluoroscopy use, emergent cardiac surgery, temporary or permanent pacemaker, and death. The patient and wife voices understanding and provides verbal feedback their questions and concerns are addressed to their satisfaction and patient wishes to  proceed with coronary angiography with possible PCI.  Patient endorses statin intolerance-will have to discuss further after heart catheterization to see which pharmacological agents he has prior and consider an alternative lower is in the hospital.  As outpatient he can be considered for PCSK9 inhibitors or Leqvio.  Start Vascepa given his hypertriglyceridemia.  Medication profile discussed.  Diabetes management per primary team.   Total encounter time 60 minutes. *Total Encounter Time as defined by the Centers for Medicare and Medicaid Services includes, in addition to the face-to-face time of a patient visit (documented in the note above) non-face-to-face time: obtaining and reviewing outside  history, ordering and reviewing medications, tests or procedures, care coordination (communications with other health care professionals or caregivers) and documentation in the medical record.  Patient's questions and concerns were addressed to his satisfaction. He voices understanding of the instructions provided during this encounter.   This note was created using a voice recognition software as a result there may be grammatical errors inadvertently enclosed that do not reflect the nature of this encounter. Every attempt is made to correct such errors.  Delilah Shan [facility]  Pager:  [phone] Office: [phone] 10/22/2022, 8:51 AM

## 2022-10-22 NOTE — ED Notes (Signed)
ED TO INPATIENT HANDOFF REPORT  ED Nurse Name and Phone #: Einar Grad [phone]  S Name/Age/Gender Scott Pineda 62 y.o. male Room/Bed: 009C/009C  Code Status   Code Status: Full Code  Home/SNF/Other Home Patient oriented to: self, place, time, and situation Is this baseline? Yes   Triage Complete: Triage complete  Chief Complaint Chest pain [R07.9]  Triage Note Pt c/o left sided chest pain that radiates to left neck and down left arm, [id number]. Pt c/o int dizzinessx2ys.   Allergies Allergies  Allergen Reactions   Glucophage Xr [Metformin] Other (See Comments)    Abdominal/stomach pain   Motrin [Ibuprofen] Other (See Comments)    Unknown reaction   Statins Other (See Comments)    Myalgias  Weakness of significance     Level of Care/Admitting Diagnosis ED Disposition     ED Disposition  Admit   Condition  --   Comment  Hospital Area: [facility] [[id number]]  Level of Care: Progressive [102]  Admit to Progressive based on following criteria: CARDIOVASCULAR & THORACIC of moderate stability with acute coronary syndrome symptoms/low risk myocardial infarction/hypertensive urgency/arrhythmias/heart failure potentially compromising stability and stable post cardiovascular intervention patients.  May admit patient to [facility] or [facility] if equivalent level of care is available:: No  Covid Evaluation: Asymptomatic - no recent exposure (last 10 days) testing not required  Diagnosis: Chest pain [744799]  Admitting Physician: Charlsie Quest [[id number]]  Attending Physician: Charlsie Quest [[id number]]  Certification:: I certify this patient will need inpatient services for at least 2 midnights          B Medical/Surgery History Past Medical History:  Diagnosis Date   ADVERSE REACTION TO MEDICATION 10/09/2008   Qualifier: Diagnosis of  By: Fabian Sharp MD, Neta Mends    Allergic rhinitis    Asthma    Depression    GERD (gastroesophageal reflux disease)     Gout    Hyperlipidemia    Hypertension    Salivary stone    Trauma    hx of falling trauma and back pain with side effects of medication, parkisonian now resolved   Past Surgical History:  Procedure Laterality Date   APPENDECTOMY     CATARACT EXTRACTION     left 2009 and right 2006   CHOLECYSTECTOMY     HERNIA REPAIR     salivary stone extracted     TONSILLECTOMY       A IV Location/Drains/Wounds Patient Lines/Drains/Airways Status     Active Line/Drains/Airways     Name Placement date Placement time Site Days   Peripheral IV 10/22/22 18 G Anterior;Left;Proximal Forearm 10/22/22  0729  Forearm  less than 1   Peripheral IV 10/22/22 20 G Left;Posterior Forearm 10/22/22  0730  Forearm  less than 1   Peripheral IV 10/22/22 20 G Anterior;Right Forearm 10/22/22  0745  Forearm  less than 1            Intake/Output Last 24 hours  Intake/Output Summary (Last 24 hours) at 10/22/2022 6578 Last data filed at 10/22/2022 0544 Gross per 24 hour  Intake 1201.41 ml  Output --  Net 1201.41 ml    Labs/Imaging Results for orders placed or performed during the hospital encounter of 10/21/22 (from the past 48 hour(s))  Basic metabolic panel     Status: Abnormal   Collection Time: 10/21/22  1:49 PM  Result Value Ref Range   Sodium 130 (L) 135 - 145 mmol/L   Potassium 3.9 3.5 -  5.1 mmol/L   Chloride 95 (L) 98 - 111 mmol/L   CO2 20 (L) 22 - 32 mmol/L   Glucose, Bld 479 (H) 70 - 99 mg/dL    Comment: Glucose reference range applies only to samples taken after fasting for at least 8 hours.   BUN 9 8 - 23 mg/dL   Creatinine, Ser 3.76 0.61 - 1.24 mg/dL   Calcium 9.3 8.9 - 28.3 mg/dL   GFR, Estimated >15 >17 mL/min    Comment: (NOTE) Calculated using the CKD-EPI Creatinine Equation (2021)    Anion gap 15 5 - 15    Comment: Performed at [facility], [address], [place], [place] [address]  CBC     Status: None   Collection Time: 10/21/22  1:49 PM  Result Value Ref Range    WBC 7.7 4.0 - 10.5 K/uL   RBC 5.17 4.22 - 5.81 MIL/uL   Hemoglobin 16.1 13.0 - 17.0 g/dL   HCT 37.1 06.2 - 69.4 %   MCV 88.0 80.0 - 100.0 fL   MCH 31.1 26.0 - 34.0 pg   MCHC 35.4 30.0 - 36.0 g/dL   RDW 85.4 62.7 - 03.5 %   Platelets 221 150 - 400 K/uL   nRBC 0.0 0.0 - 0.2 %    Comment: Performed at [facility], [address] [place], [place] [address]  Troponin I (High Sensitivity)     Status: None   Collection Time: 10/21/22  1:49 PM  Result Value Ref Range   Troponin I (High Sensitivity) 7 <18 ng/L    Comment: (NOTE) Elevated high sensitivity troponin I (hsTnI) values and significant  changes across serial measurements may suggest ACS but many other  chronic and acute conditions are known to elevate hsTnI results.  Refer to the "Links" section for chest pain algorithms and additional  guidance. Performed at [facility], [address], [place], [place] [address]   CBG monitoring, ED     Status: Abnormal   Collection Time: 10/21/22  1:49 PM  Result Value Ref Range   Glucose-Capillary 523 (HH) 70 - 99 mg/dL    Comment: Glucose reference range applies only to samples taken after fasting for at least 8 hours.   Comment 1 Notify RN   Blood gas, venous (at [facility])     Status: Abnormal   Collection Time: 10/21/22  3:51 PM  Result Value Ref Range   pH, Ven 7.43 7.25 - 7.43   pCO2, Ven 41 (L) 44 - 60 mmHg   pO2, Ven 45 32 - 45 mmHg   Bicarbonate 27.2 20.0 - 28.0 mmol/L   Acid-Base Excess 2.6 (H) 0.0 - 2.0 mmol/L   O2 Saturation 80.2 %   Patient temperature 37.0    Drawn by DRAWN BY RN     Comment: Performed at [facility], [address], [place], [place] [address]  Beta-hydroxybutyric acid     Status: Abnormal   Collection Time: 10/21/22  3:53 PM  Result Value Ref Range   Beta-Hydroxybutyric Acid 1.40 (H) 0.05 - 0.27 mmol/L    Comment: Performed at [facility], [address] [place], [place] [address]  Troponin I (High Sensitivity)      Status: None   Collection Time: 10/21/22  3:53 PM  Result Value Ref Range   Troponin I (High Sensitivity) 8 <18 ng/L    Comment: (NOTE) Elevated high sensitivity troponin I (hsTnI) values and significant  changes across serial measurements may suggest ACS but many other  chronic and acute conditions are known to elevate hsTnI results.  Refer to the "Links" section for chest pain algorithms and additional  guidance. Performed at [facility], [address] [place], [place] [address]   POC CBG, ED     Status: Abnormal   Collection Time: 10/21/22  4:07 PM  Result Value Ref Range   Glucose-Capillary 386 (H) 70 - 99 mg/dL    Comment: Glucose reference range applies only to samples taken after fasting for at least 8 hours.  CBG monitoring, ED     Status: Abnormal   Collection Time: 10/21/22 10:08 PM  Result Value Ref Range   Glucose-Capillary 353 (H) 70 - 99 mg/dL    Comment: Glucose reference range applies only to samples taken after fasting for at least 8 hours.  CBG monitoring, ED     Status: Abnormal   Collection Time: 10/22/22 12:17 AM  Result Value Ref Range   Glucose-Capillary 303 (H) 70 - 99 mg/dL    Comment: Glucose reference range applies only to samples taken after fasting for at least 8 hours.  Heparin level (unfractionated)     Status: Abnormal   Collection Time: 10/22/22  3:30 AM  Result Value Ref Range   Heparin Unfractionated 0.25 (L) 0.30 - 0.70 IU/mL    Comment: (NOTE) The clinical reportable range upper limit is being lowered to >1.10 to align with the FDA approved guidance for the current laboratory assay.  If heparin results are below expected values, and patient dosage has  been confirmed, suggest follow up testing of antithrombin III levels. Performed at [facility], [address] [place], [place] [address]   Basic metabolic panel     Status: Abnormal   Collection Time: 10/22/22  3:30 AM  Result Value Ref Range   Sodium 135 135 - 145  mmol/L   Potassium 3.1 (L) 3.5 - 5.1 mmol/L   Chloride 100 98 - 111 mmol/L   CO2 21 (L) 22 - 32 mmol/L   Glucose, Bld 274 (H) 70 - 99 mg/dL    Comment: Glucose reference range applies only to samples taken after fasting for at least 8 hours.   BUN 7 (L) 8 - 23 mg/dL   Creatinine, Ser 8.29 0.61 - 1.24 mg/dL   Calcium 8.1 (L) 8.9 - 10.3 mg/dL   GFR, Estimated >56 >21 mL/min    Comment: (NOTE) Calculated using the CKD-EPI Creatinine Equation (2021)    Anion gap 14 5 - 15    Comment: Performed at [facility], [address], [place], [place] [address]  CBC     Status: None   Collection Time: 10/22/22  3:30 AM  Result Value Ref Range   WBC 7.4 4.0 - 10.5 K/uL   RBC 4.54 4.22 - 5.81 MIL/uL   Hemoglobin 13.5 13.0 - 17.0 g/dL   HCT 78.4 69.6 - 29.5 %   MCV 87.4 80.0 - 100.0 fL   MCH 29.7 26.0 - 34.0 pg   MCHC 34.0 30.0 - 36.0 g/dL   RDW 28.4 13.2 - 44.0 %   Platelets 183 150 - 400 K/uL   nRBC 0.0 0.0 - 0.2 %    Comment: Performed at [facility], [address] [place], [place] [address]  Hemoglobin A1c     Status: Abnormal   Collection Time: 10/22/22  3:30 AM  Result Value Ref Range   Hgb  A1c MFr Bld 13.3 (H) 4.8 - 5.6 %    Comment: (NOTE) Pre diabetes:          5.7%-6.4%  Diabetes:              >6.4%  Glycemic control for   <7.0% adults with diabetes    Mean Plasma Glucose 335.01 mg/dL    Comment: Performed at [facility], [address], [place], [place] [address]  Lipid panel     Status: Abnormal   Collection Time: 10/22/22  3:30 AM  Result Value Ref Range   Cholesterol 227 (H) 0 - 200 mg/dL   Triglycerides 191 (H) <150 mg/dL   HDL 30 (L) >47 mg/dL   Total CHOL/HDL Ratio 7.6 RATIO   VLDL 69 (H) 0 - 40 mg/dL   LDL Cholesterol 829 (H) 0 - 99 mg/dL    Comment:        Total Cholesterol/HDL:CHD Risk Coronary Heart Disease Risk Table                     Men   Women  1/2 Average Risk   3.4   3.3  Average Risk       5.0   4.4  2 X Average Risk   9.6    7.1  3 X Average Risk  23.4   11.0        Use the calculated Patient Ratio above and the CHD Risk Table to determine the patient's CHD Risk.        ATP III CLASSIFICATION (LDL):  <100     mg/dL   Optimal  562-130  mg/dL   Near or Above                    Optimal  130-159  mg/dL   Borderline  865-784  mg/dL   High  >696     mg/dL   Very High Performed at [facility], [address], [place], [place] [address]   Magnesium     Status: None   Collection Time: 10/22/22  3:30 AM  Result Value Ref Range   Magnesium 1.9 1.7 - 2.4 mg/dL    Comment: Performed at [facility], [address] [place], [place] [address]  CBG monitoring, ED     Status: Abnormal   Collection Time: 10/22/22  7:43 AM  Result Value Ref Range   Glucose-Capillary 327 (H) 70 - 99 mg/dL    Comment: Glucose reference range applies only to samples taken after fasting for at least 8 hours.   CT Angio Chest PE W/Cm &/Or Wo Cm  Result Date: 10/21/2022 CLINICAL DATA:  Shortness of breath.  PE suspected EXAM: CT ANGIOGRAPHY CHEST WITH CONTRAST TECHNIQUE: Multidetector CT imaging of the chest was performed using the standard protocol during bolus administration of intravenous contrast. Multiplanar CT image reconstructions and MIPs were obtained to evaluate the vascular anatomy. RADIATION DOSE REDUCTION: This exam was performed according to the departmental dose-optimization program which includes automated exposure control, adjustment of the mA and/or kV according to patient size and/or use of iterative reconstruction technique. CONTRAST:  75mL OMNIPAQUE IOHEXOL 350 MG/ML SOLN COMPARISON:  Chest radiograph 10/21/2022 and CT chest report from 08/24/2020 FINDINGS: Cardiovascular: Negative for acute pulmonary embolism. Normal caliber thoracic aorta. No pericardial effusion. Aortic and coronary artery atherosclerotic calcification. Mediastinum/Nodes: Trachea and esophagus are unremarkable. No thoracic adenopathy  Lungs/Pleura: No focal consolidation, pleural effusion, or pneumothorax. Upper Abdomen:  No acute abnormality. Musculoskeletal: No acute fracture. Review of the MIP images confirms the above findings. IMPRESSION: Negative for acute pulmonary embolism. No acute abnormality in the chest. Aortic Atherosclerosis (ICD10-I70.0). Electronically Signed   By: Minerva Fester M.D.   On: 10/21/2022 18:14   DG Chest 2 View  Result Date: 10/21/2022 CLINICAL DATA:  Major chest pain that radiates into the left arm and neck EXAM: CHEST - 2 VIEW COMPARISON:  X-ray 09/03/2022 FINDINGS: The heart size and mediastinal contours are within normal limits. No consolidation, pneumothorax or effusion. No edema. The visualized skeletal structures are unremarkable. Mild pectus excavatum on lateral view. Overlapping cardiac leads. IMPRESSION: No acute cardiopulmonary disease. Electronically Signed   By: Karen Kays M.D.   On: 10/21/2022 17:58    Pending Labs Unresulted Labs (From admission, onward)     Start     Ordered   10/23/22 0500  Heparin level (unfractionated)  Daily,   R      10/21/22 2038   10/22/22 1100  Heparin level (unfractionated)  Once-Timed,   TIMED        10/22/22 0413   10/22/22 0500  HIV Antibody (routine testing w rflx)  (HIV Antibody (Routine testing w reflex) panel)  Tomorrow morning,   R        10/21/22 2105            Vitals/Pain Today's Vitals   10/22/22 0735 10/22/22 0800 10/22/22 0809 10/22/22 0809  BP: 105/64 124/82  112/66  Pulse: 74 73  72  Resp: 14 13  (!) 9  Temp: 97.6 F (36.4 C)     TempSrc: Oral     SpO2: 100% 99%  100%  Weight:      Height:      PainSc:   5      Isolation Precautions No active isolations  Medications Medications  nitroGLYCERIN 50 mg in dextrose 5 % 250 mL (0.2 mg/mL) infusion (25 mcg/min Intravenous Rate/Dose Change 10/22/22 0811)  heparin ADULT infusion 100 units/mL (25000 units/263mL) (1,400 Units/hr Intravenous Infusion Verify 10/22/22 0715)  sodium  chloride flush (NS) 0.9 % injection 3 mL (3 mLs Intravenous Not Given 10/21/22 2105)  acetaminophen (TYLENOL) tablet 650 mg (650 mg Oral Given 10/21/22 2216)    Or  acetaminophen (TYLENOL) suppository 650 mg ( Rectal See Alternative 10/21/22 2216)  ondansetron (ZOFRAN) tablet 4 mg (has no administration in time range)    Or  ondansetron (ZOFRAN) injection 4 mg (has no administration in time range)  senna-docusate (Senokot-S) tablet 1 tablet (has no administration in time range)  insulin glargine-yfgn (SEMGLEE) injection 10 Units (10 Units Subcutaneous Given 10/21/22 2358)  insulin aspart (novoLOG) injection 0-15 Units (11 Units Subcutaneous Given 10/22/22 0758)  insulin aspart (novoLOG) injection 0-5 Units (5 Units Subcutaneous Given 10/21/22 2217)  albuterol (PROVENTIL) (2.5 MG/3ML) 0.083% nebulizer solution 2.5 mg (has no administration in time range)  lisinopril (ZESTRIL) tablet 20 mg (has no administration in time range)  sertraline (ZOLOFT) tablet 150 mg (has no administration in time range)  aspirin EC tablet 81 mg (81 mg Oral Given 10/21/22 2216)  potassium chloride 10 mEq in 100 mL IVPB (10 mEq Intravenous New Bag/Given 10/22/22 0804)  0.9 %  sodium chloride infusion ( Intravenous New Bag/Given 10/22/22 0800)  sodium chloride 0.9 % bolus 1,000 mL (0 mLs Intravenous Stopped 10/21/22 1912)  morphine (PF) 4 MG/ML injection 4 mg (4 mg Intravenous Given 10/21/22 1547)  ondansetron (ZOFRAN) injection 4 mg (4 mg Intravenous Given 10/21/22  1545)  insulin aspart (novoLOG) injection 10 Units (10 Units Subcutaneous Given 10/21/22 1651)  iohexol (OMNIPAQUE) 350 MG/ML injection 75 mL (75 mLs Intravenous Contrast Given 10/21/22 1637)  morphine (PF) 4 MG/ML injection 4 mg (4 mg Intravenous Given 10/21/22 1724)  heparin bolus via infusion 4,000 Units (4,000 Units Intravenous Bolus from Bag 10/21/22 2058)    Mobility walks     Focused Assessments Cardiac Assessment Handoff:  Cardiac Rhythm: Normal sinus rhythm No results  found for: "CKTOTAL", "CKMB", "CKMBINDEX", "TROPONINI" No results found for: "DDIMER" Does the Patient currently have chest pain? Yes    R Recommendations: See Admitting Provider Note  Report given to:   Additional Notes: Going for cardiac cath today; on nitroglycerin and heparin infusions; receiving IV potassium.

## 2022-10-22 NOTE — Progress Notes (Signed)
ANTICOAGULATION CONSULT NOTE - Follow Up  Pharmacy Consult for Heparin Indication: chest pain/ACS  Allergies  Allergen Reactions   Glucophage Xr [Metformin] Other (See Comments)    Abdominal/stomach pain   Motrin [Ibuprofen] Other (See Comments)    Unknown reaction   Statins Other (See Comments)    Myalgias  Weakness of significance     Patient Measurements: Height: 6' (182.9 cm) Weight: 86.4 kg (190 lb 7.6 oz) IBW/kg (Calculated) : 77.6 Heparin Dosing Weight: 86.4 kg  Vital Signs: Temp: 97.5 F (36.4 C) (09/04 0340) Temp Source: Axillary (09/04 0340) BP: 124/69 (09/04 0340) Pulse Rate: 73 (09/04 0340)  Labs: Recent Labs    10/21/22 1349 10/21/22 1553 10/22/22 0330  HGB 16.1  --   --   HCT 45.5  --   --   PLT 221  --   --   HEPARINUNFRC  --   --  0.25*  CREATININE 0.81  --   --   TROPONINIHS 7 8  --     Estimated Creatinine Clearance: 103.8 mL/min (by C-G formula based on SCr of 0.81 mg/dL).   Medical History: Past Medical History:  Diagnosis Date   ADVERSE REACTION TO MEDICATION 10/09/2008   Qualifier: Diagnosis of  By: Fabian Sharp MD, Neta Mends    Allergic rhinitis    Asthma    Depression    GERD (gastroesophageal reflux disease)    Gout    Hyperlipidemia    Hypertension    Salivary stone    Trauma    hx of falling trauma and back pain with side effects of medication, parkisonian now resolved    Medications:  (Not in a hospital admission)  Scheduled:   aspirin EC  81 mg Oral Daily   insulin aspart  0-15 Units Subcutaneous TID WC   insulin aspart  0-5 Units Subcutaneous QHS   insulin glargine-yfgn  10 Units Subcutaneous QHS   lisinopril  20 mg Oral Daily   sertraline  150 mg Oral Daily   sodium chloride flush  3 mL Intravenous Q12H   Infusions:   heparin 1,200 Units/hr (10/21/22 0272)   nitroGLYCERIN 20 mcg/min (10/21/22 2026)   PRN: acetaminophen **OR** acetaminophen, albuterol, ondansetron **OR** ondansetron (ZOFRAN) IV,  senna-docusate  Assessment: 61 yom with a history of HTN, HLD, DM. Patient is presenting with chest pain and SOB. Heparin per pharmacy consult placed for chest pain/ACS.  Patient is not on anticoagulation prior to arrival.  Hgb 16.1; plt 221  9/4 AM: heparin level returned at 0.25 on 1200 units/hr (subtherapeutic). Per RN, no issues with the heparin infusion running continuously or signs/symptoms of bleeding. Last CBC stable.  Goal of Therapy:  Heparin level 0.3-0.7 units/ml Monitor platelets by anticoagulation protocol: Yes   Plan:  Increase heparin infusion to 1400 units/hr Check anti-Xa level in 6 hours and daily while on heparin Continue to monitor H&H and platelets  Arabella Merles, PharmD. Clinical Pharmacist 10/22/2022 4:10 AM

## 2022-10-22 NOTE — ED Notes (Signed)
Per cath lab, administer all scheduled oral medications including oral potassium.

## 2022-10-22 NOTE — Inpatient Diabetes Management (Signed)
Inpatient Diabetes Program Recommendations  AACE/ADA: New Consensus Statement on Inpatient Glycemic Control (2015)  Target Ranges:  Prepandial:   less than 140 mg/dL      Peak postprandial:   less than 180 mg/dL (1-2 hours)      Critically ill patients:  140 - 180 mg/dL   Lab Results  Component Value Date   GLUCAP 255 (H) 10/22/2022   HGBA1C 13.3 (H) 10/22/2022    Latest Reference Range & Units 10/21/22 13:49 10/21/22 16:07 10/21/22 22:08 10/22/22 00:17 10/22/22 07:43 10/22/22 09:29  Glucose-Capillary 70 - 99 mg/dL 119 (HH) 147 (H) 829 (H) 303 (H) 327 (H) 255 (H)  (HH): Data is critically high (H): Data is abnormally high  Diabetes history: DM2 Outpatient Diabetes medications: Semglee 25, glipizide 5 mg, and Farxiga 5 mg  Current orders for Inpatient glycemic control: Semglee 25 units, Novolog 0-15 units tid, 0-5 units hs correction  Inpatient Diabetes Program Recommendations:   Please consider while Glipizide held: -Novolog 5 units tid meal coverage if eats 50%  Spoke with pt and wife about A1C results 13.3 (average blood glucose 335 over the past 2-3 months) and explained what an A1C is, basic pathophysiology of DM Type 2, basic home care, basic diabetes diet nutrition principles, importance of checking CBGs and maintaining good CBG control to prevent long-term and short-term complications. Reviewed signs and symptoms of hyperglycemia and hypoglycemia and how to treat hypoglycemia at home. Also reviewed blood sugar goals at home.  RNs to provide ongoing basic DM education at bedside with this patient.   Noted patient recently stopped Metformin due to GI side effects. Ordered Living Well With Diabetes Book for patient review. Patient started Semglee 25 units qday 2 weeks ago and found out @ the office visit of A1c 13.0. Patient and wife has planned to do more meal planning with patient and patient plans to decrease orange juice intake (has been drinking a full glass each day) and  decrease high carbohydrate foods. Patient also plans to increase walking and activity as allowed by physician.  Thank you, Billy Fischer. Patrena Santalucia, RN, MSN, CDE  Diabetes Coordinator Inpatient Glycemic Control Team Team Pager [phone] (8am-5pm) 10/22/2022 2:40 PM

## 2022-10-23 ENCOUNTER — Observation Stay (HOSPITAL_COMMUNITY): Payer: 59

## 2022-10-23 ENCOUNTER — Encounter (HOSPITAL_COMMUNITY): Payer: Self-pay | Admitting: Cardiology

## 2022-10-23 DIAGNOSIS — G928 Other toxic encephalopathy: Secondary | ICD-10-CM | POA: Diagnosis not present

## 2022-10-23 DIAGNOSIS — R079 Chest pain, unspecified: Secondary | ICD-10-CM | POA: Diagnosis not present

## 2022-10-23 LAB — URINALYSIS, ROUTINE W REFLEX MICROSCOPIC
Bacteria, UA: NONE SEEN
Bilirubin Urine: NEGATIVE
Glucose, UA: >=500 mg/dL — AB
Hgb urine dipstick: NEGATIVE
Ketones, ur: 5 mg/dL — AB
Leukocytes,Ua: NEGATIVE
Nitrite: NEGATIVE
Protein, ur: NEGATIVE mg/dL
Specific Gravity, Urine: 1.028 (ref 1.005–1.030)
pH: 6 (ref 5.0–8.0)

## 2022-10-23 LAB — COMPREHENSIVE METABOLIC PANEL
ALT: 17 U/L (ref 0–44)
AST: 18 U/L (ref 15–41)
Albumin: 3.7 g/dL (ref 3.5–5.0)
Alkaline Phosphatase: 48 U/L (ref 38–126)
Anion gap: 17 — ABNORMAL HIGH (ref 5–15)
BUN: 15 mg/dL (ref 8–23)
CO2: 23 mmol/L (ref 22–32)
Calcium: 9.4 mg/dL (ref 8.9–10.3)
Chloride: 98 mmol/L (ref 98–111)
Creatinine, Ser: 1.27 mg/dL — ABNORMAL HIGH (ref 0.61–1.24)
GFR, Estimated: >60 mL/min (ref >60)
Glucose, Bld: 257 mg/dL — ABNORMAL HIGH (ref 70–99)
Potassium: 3.9 mmol/L (ref 3.5–5.1)
Sodium: 138 mmol/L (ref 135–145)
Total Bilirubin: 0.9 mg/dL (ref 0.3–1.2)
Total Protein: 6.6 g/dL (ref 6.5–8.1)

## 2022-10-23 LAB — CBC
HCT: 42.8 % (ref 39.0–52.0)
Hemoglobin: 14.5 g/dL (ref 13.0–17.0)
MCH: 29.9 pg (ref 26.0–34.0)
MCHC: 33.9 g/dL (ref 30.0–36.0)
MCV: 88.2 fL (ref 80.0–100.0)
Platelets: 194 10*3/uL (ref 150–400)
RBC: 4.85 MIL/uL (ref 4.22–5.81)
RDW: 13.1 % (ref 11.5–15.5)
WBC: 6.8 10*3/uL (ref 4.0–10.5)
nRBC: 0 % (ref 0.0–0.2)

## 2022-10-23 LAB — GLUCOSE, CAPILLARY
Glucose-Capillary: 242 mg/dL — ABNORMAL HIGH (ref 70–99)
Glucose-Capillary: 271 mg/dL — ABNORMAL HIGH (ref 70–99)
Glucose-Capillary: 340 mg/dL — ABNORMAL HIGH (ref 70–99)
Glucose-Capillary: 383 mg/dL — ABNORMAL HIGH (ref 70–99)
Glucose-Capillary: 267 mg/dL — ABNORMAL HIGH (ref 70–99)

## 2022-10-23 LAB — BASIC METABOLIC PANEL
Anion gap: 10 (ref 5–15)
BUN: 12 mg/dL (ref 8–23)
CO2: 25 mmol/L (ref 22–32)
Calcium: 9 mg/dL (ref 8.9–10.3)
Chloride: 100 mmol/L (ref 98–111)
Creatinine, Ser: 1.02 mg/dL (ref 0.61–1.24)
GFR, Estimated: >60 mL/min (ref >60)
Glucose, Bld: 243 mg/dL — ABNORMAL HIGH (ref 70–99)
Potassium: 3.9 mmol/L (ref 3.5–5.1)
Sodium: 135 mmol/L (ref 135–145)

## 2022-10-23 LAB — MAGNESIUM: Magnesium: 2 mg/dL (ref 1.7–2.4)

## 2022-10-23 LAB — AMMONIA: Ammonia: 33 umol/L (ref 9–35)

## 2022-10-23 MED ORDER — INSULIN GLARGINE-YFGN 100 UNIT/ML ~~LOC~~ SOLN
30.0000 [IU] | Freq: Every day | SUBCUTANEOUS | Status: DC
  Administered 2022-10-23: 30 [IU] via SUBCUTANEOUS
  Filled 2022-10-23 (×2): qty 0.3

## 2022-10-23 NOTE — Progress Notes (Signed)
EEG complete - results pending 

## 2022-10-23 NOTE — Code Documentation (Addendum)
Scott Pineda is a 62 yr old male inpatient on 6 E 16. Pt was last known well today at 1230. Soon after that, he got out of bed and fell. MD was notified, and CT ordered. Pt in route to CT when staff noted left side moving less than right, and code stroke was activated. Pt has been on Heparin infusion (stopped yesterday AM).     Pt met at CT scanner by Stroke Team. Pt with weakness overall, with Left leg weakest. He answered both orientation questions incorrectly. (NIHSS 7) Please see documentation for NIHSS details and timeline. The following imaging was obtained: CT head. Per Dr. Wilford Corner, CT head neg for acute abnormality. Pt taken to Stat MRI. Per Dr. Wilford Corner, MRI neg for ischemia.     Code stroke alert  cancelled at 15:53.  Bedside handoff with 6 E RN complete. Pt ineligible for TNK as recent heparin and no stroke seen on MRI. Pt ineligible for thrombectomy due to MRI neg for stroke.

## 2022-10-23 NOTE — Progress Notes (Signed)
Mobility Specialist Progress Note:    10/23/22 1332  Mobility  Activity Ambulated with assistance in room  Level of Assistance Moderate assist, patient does 50-74%  Assistive Device Other (Comment) (HHA)  Activity Response Tolerated fair  Mobility Referral Yes  $Mobility charge 1 Mobility  Mobility Specialist Start Time (ACUTE ONLY) 1215  Mobility Specialist Stop Time (ACUTE ONLY) 1229  Mobility Specialist Time Calculation (min) (ACUTE ONLY) 14 min   Assisted RN w/ pt, after pt had fallen. Pt was a +2 ModA to stand and get into the chair. Pt states he was feeling very dizzy and needed a break. After a few moment mobility specialist and RN assisted pt back to bed. Was able to take a couple of steps towards the bed w/o fault. Left aupine in bed. w/ RN in room and bed alarm on.   Thompson Grayer Mobility Specialist  Please contact vis Secure Chat or  Rehab Office [phone]

## 2022-10-23 NOTE — Progress Notes (Signed)
PROGRESS NOTE    Scott Pineda  WUJ:[id number] DOB: 01-09-1961 DOA: 10/21/2022 PCP: [facility]     Brief Narrative:  Scott Pineda is a 62 y.o. male with past medical history significant for  insulin-dependent T2DM, HTN, HLD, asthma, remote history of sarcoidosis in remission, depression/anxiety who presented to the ED for evaluation of chest pain.  Patient states that he has been having chest pain on and off for the last 2 months.  Pain is described as sharp and sometimes pressure-like sensation.  It occurs in the midsternal to left chest area with radiation down his left arm and up his neck.  It may occur with rest.  Sometimes it is associated with dyspnea and diaphoresis. Patient states that this morning chest pain returned and was very severe.  He also reports cough with deep inspiration.  He denies any known personal cardiac history.  He is adopted and does not know his biological family history.  He is a never smoker but states that he was exposed to smoke in the house growing up.   He underwent nuclear stress testing recently 10/07/2022 in [facility] which did not show reversible ischemia or infarction.  Normal LV wall motion, EF 60%.   Patient reports severe intolerance to statin which caused significant myopathy about 12 years ago.  He is not currently on aspirin.  Blood sugars have been poorly controlled recently.  He states that he stopped taking metformin due to GI intolerance and is now on insulin glargine 25 units every evening in addition to glipizide and [place].   In the ED, BP 159/104, pulse 110, RR 20, temp 97.8 F, SpO2 97% on room air. Sodium 130 (139 when corrected for hyperglycemia), potassium 3.9, bicarb 20, BUN 9, creatinine 0.81, serum glucose 479, WBC 7.7, hemoglobin 16.1, platelets 221,000, troponin 7 > 8, beta hydroxybutyrate 1.40. CTA chest negative for PE or acute abnormality.  Aortic atherosclerosis noted. Patient was given 10 units subcutaneous NovoLog, 1 L  normal saline, IV morphine 4 mg x 2.  EDP spoke with on-call [facility] cardiology Dr. Odis Hollingshead who recommended starting on IV heparin, medical admission, and potential cardiac cath in the morning.  Patient was started on IV nitroglycerin infusion.  The hospitalist service was consulted to admit for further evaluation and management.  Patient underwent heart cath 9/4.   New events last 24 hours / Subjective: During morning evaluation, patient was feeling well without any new complaints.  Wife was at bedside.  We had talked about discharging later in the afternoon after cardiology evaluation.  After rounds, I was notified by RN that patient felt getting out of bed, hit his head.  Ordered CT head.  I reevaluated patient prior to CT head and patient complained of headache in his temples which were sharp and throbbing in nature.  He seemed quite sleepy.  Had some weakness of the left lower extremity.  His IV heparin was discontinued yesterday afternoon at time of heart cath.  Assessment & Plan:  Principal Problem:   Chest pain Active Problems:   Type 2 diabetes mellitus with hyperglycemia, with long-term current use of insulin (HCC)   Hypertension associated with diabetes (HCC)   Hyperlipidemia associated with type 2 diabetes mellitus (HCC)   Depression   Asthma   Statin intolerance   Unstable angina (HCC)   Pure hypertriglyceridemia    Unstable angina Patient presenting with continued chest discomfort, ongoing for 2 months.  Localized to his substernal region with radiation to his left  arm/jaw.  He has had recent normal stress test at OSH.  Troponin is negative x 2 and EKG without acute ischemic changes.   -- Status post heart cath 9/4: Mild nonobstructive coronary artery disease --Echocardiogram with EF 60 to 65%, no regional wall motion abnormality, normal LV diastolic parameter -- Aspirin  Acute metabolic encephalopathy after fall and trauma to his head -Stat CT head negative for acute  abnormality -MRI brain formal result pending   Type 2 diabetes mellitus with hyperglycemia, with long-term current use of insulin  Hemoglobin A1c 13.3, poorly controlled.  Recently stopped metformin due to GI side effect.  Home regimen includes Semglee 25 units subcutaneously nightly, glipizide 5 mg p.o. daily and Farxiga. -- Diabetic educator consult -- Semglee, NovoLog, sliding scale insulin.  Dose adjusted today due to hyperglycemia   Hypertension associated with diabetes  -- Lisinopril   Hyperlipidemia associated with type 2 diabetes mellitus  -- Vascepa    Asthma -- Albuterol neb every 6 hours as needed wheezing/shortness of breath   Depression -- Zoloft  DVT prophylaxis:  heparin injection 5,000 Units Start: 10/22/22 2200 SCD's Start: 10/22/22 1157  Code Status: Full Family Communication: Wife Disposition Plan: Home Status is: Observation The patient will require care spanning > 2 midnights and should be moved to inpatient because: Need further workup, PT evaluation and stabilization before discharge home    Antimicrobials:  Anti-infectives (From admission, onward)    None        Objective: Vitals:   10/23/22 1143 10/23/22 1206 10/23/22 1208 10/23/22 1229  BP: 130/85 (!) 173/97 (!) 164/92 (!) 155/82  Pulse:  94 96 94  Resp: 18     Temp: 98.6 F (37 C)     TempSrc: Oral     SpO2:  96% 95% 95%  Weight:      Height:        Intake/Output Summary (Last 24 hours) at 10/23/2022 1425 Last data filed at 10/23/2022 1223 Gross per 24 hour  Intake 352.12 ml  Output 2250 ml  Net -1897.88 ml   Filed Weights   10/21/22 2036 10/23/22 0444  Weight: 86.4 kg 83 kg    Examination:  General exam: Appears calm and comfortable  Respiratory system: Clear to auscultation. Respiratory effort normal. No respiratory distress. No conversational dyspnea.  Cardiovascular system: S1 & S2 heard, RRR. No murmurs. No pedal edema. Gastrointestinal system: Abdomen is nondistended,  soft and nontender. Normal bowel sounds heard. Central nervous system (second eval): Alert, CN 2-12 grossly intact.  Finger-nose intact.  Left lower extremity weaker compared to right Extremities: Symmetric in appearance  Skin: No rashes, lesions or ulcers on exposed skin  Psychiatry: Judgement and insight appear normal. Mood & affect appropriate.   Data Reviewed: I have personally reviewed following labs and imaging studies  CBC: Recent Labs  Lab 10/21/22 1349 10/22/22 0330 10/23/22 0419  WBC 7.7 7.4 6.8  HGB 16.1 13.5 14.5  HCT 45.5 39.7 42.8  MCV 88.0 87.4 88.2  PLT 221 183 194   Basic Metabolic Panel: Recent Labs  Lab 10/21/22 1349 10/22/22 0330 10/23/22 0419  NA 130* 135 135  K 3.9 3.1* 3.9  CL 95* 100 100  CO2 20* 21* 25  GLUCOSE 479* 274* 243*  BUN 9 7* 12  CREATININE 0.81 0.78 1.02  CALCIUM 9.3 8.1* 9.0  MG  --  1.9 2.0   GFR: Estimated Creatinine Clearance: 82.4 mL/min (by C-G formula based on SCr of 1.02 mg/dL). Liver Function Tests: No  results for input(s): "AST", "ALT", "ALKPHOS", "BILITOT", "PROT", "ALBUMIN" in the last 168 hours. No results for input(s): "LIPASE", "AMYLASE" in the last 168 hours. No results for input(s): "AMMONIA" in the last 168 hours. Coagulation Profile: No results for input(s): "INR", "PROTIME" in the last 168 hours. Cardiac Enzymes: No results for input(s): "CKTOTAL", "CKMB", "CKMBINDEX", "TROPONINI" in the last 168 hours. BNP (last 3 results) No results for input(s): "PROBNP" in the last 8760 hours. HbA1C: Recent Labs    10/22/22 0330  HGBA1C 13.3*   CBG: Recent Labs  Lab 10/22/22 1627 10/22/22 2115 10/23/22 0807 10/23/22 1142 10/23/22 1314  GLUCAP 330* 169* 242* 271* 340*   Lipid Profile: Recent Labs    10/22/22 0330  CHOL 227*  HDL 30*  LDLCALC 128*  TRIG 345*  CHOLHDL 7.6   Thyroid Function Tests: No results for input(s): "TSH", "T4TOTAL", "FREET4", "T3FREE", "THYROIDAB" in the last 72 hours. Anemia  Panel: No results for input(s): "VITAMINB12", "FOLATE", "FERRITIN", "TIBC", "IRON", "RETICCTPCT" in the last 72 hours. Sepsis Labs: No results for input(s): "PROCALCITON", "LATICACIDVEN" in the last 168 hours.  No results found for this or any previous visit (from the past 240 hour(s)).    Radiology Studies: CT HEAD CODE STROKE WO CONTRAST`  Result Date: 10/23/2022 CLINICAL DATA:  Code stroke. Head trauma, coagulopathy (Age 71-64y) fall. Left-sided weakness and drowsiness. EXAM: CT HEAD WITHOUT CONTRAST TECHNIQUE: Contiguous axial images were obtained from the base of the skull through the vertex without intravenous contrast. RADIATION DOSE REDUCTION: This exam was performed according to the departmental dose-optimization program which includes automated exposure control, adjustment of the mA and/or kV according to patient size and/or use of iterative reconstruction technique. COMPARISON:  CT head April 25, 2005 FINDINGS: Brain: No evidence of acute infarction, hemorrhage, hydrocephalus, extra-axial collection or mass lesion/mass effect. Vascular: No hyperdense vessel identified. Skull: No acute fracture. Sinuses/Orbits: Mostly clear sinuses.  No acute orbital findings. Other: No mastoid effusions. ASPECTS [facility] Stroke Program Early CT Score) Total score (0-10 with 10 being normal): 10. IMPRESSION: 1. No evidence of acute intracranial abnormality. 2. ASPECTS is 10. Code stroke imaging results were communicated on 10/23/2022 at 1:13 pm to provider Dr. Wilford Corner via secure text paging. Electronically Signed   By: Feliberto Harts M.D.   On: 10/23/2022 13:14   ECHOCARDIOGRAM COMPLETE  Result Date: 10/22/2022    ECHOCARDIOGRAM REPORT   Patient Name:   Scott Pineda Date of Exam: 10/22/2022 Medical Rec #:  [id number]       Height:       72.0 in Accession #:    [id number]      Weight:       190.5 lb Date of Birth:  May 09, 1960      BSA:          2.087 m Patient Age:    61 years        BP:           135/84 mmHg  Patient Gender: M               HR:           85 bpm. Exam Location:  Inpatient Procedure: 2D Echo, Cardiac Doppler and Color Doppler Indications:    Chest Pain R07.9  History:        Patient has no prior history of Echocardiogram examinations.                 Angina, Signs/Symptoms:Chest Pain; Risk Factors:Hypertension,  Diabetes and Dyslipidemia.  Sonographer:    Lucendia Herrlich Referring Phys: [id number] VISHAL R PATEL IMPRESSIONS  1. Left ventricular ejection fraction, by estimation, is 60 to 65%. The left ventricle has normal function. The left ventricle has no regional wall motion abnormalities. Left ventricular diastolic parameters were normal.  2. Right ventricular systolic function is normal. The right ventricular size is normal. There is normal pulmonary artery systolic pressure.  3. The mitral valve is normal in structure. No evidence of mitral valve regurgitation. No evidence of mitral stenosis.  4. The aortic valve is tricuspid. Aortic valve regurgitation is trivial. No aortic stenosis is present.  5. The inferior vena cava is normal in size with greater than 50% respiratory variability, suggesting right atrial pressure of 3 mmHg.  6. Rhythm strip during this exam demonstrates normal sinus rhythm. Comparison(s): No prior Echocardiogram. FINDINGS  Left Ventricle: Left ventricular ejection fraction, by estimation, is 60 to 65%. The left ventricle has normal function. The left ventricle has no regional wall motion abnormalities. The left ventricular internal cavity size was normal in size. There is  no left ventricular hypertrophy. Left ventricular diastolic parameters were normal. Normal left ventricular filling pressure. Right Ventricle: The right ventricular size is normal. No increase in right ventricular wall thickness. Right ventricular systolic function is normal. There is normal pulmonary artery systolic pressure. The tricuspid regurgitant velocity is 1.12 m/s, and  with an assumed  right atrial pressure of 3 mmHg, the estimated right ventricular systolic pressure is 8.0 mmHg. Left Atrium: Left atrial size was normal in size. Right Atrium: Right atrial size was normal in size. Pericardium: There is no evidence of pericardial effusion. Mitral Valve: The mitral valve is normal in structure. No evidence of mitral valve regurgitation. No evidence of mitral valve stenosis. Tricuspid Valve: The tricuspid valve is grossly normal. Tricuspid valve regurgitation is trivial. No evidence of tricuspid stenosis. Aortic Valve: The aortic valve is tricuspid. Aortic valve regurgitation is trivial. No aortic stenosis is present. Aortic valve peak gradient measures 6.4 mmHg. Pulmonic Valve: The pulmonic valve was normal in structure. Pulmonic valve regurgitation is not visualized. No evidence of pulmonic stenosis. Aorta: The aortic root and ascending aorta are structurally normal, with no evidence of dilitation. Venous: The inferior vena cava is normal in size with greater than 50% respiratory variability, suggesting right atrial pressure of 3 mmHg. IAS/Shunts: The interatrial septum was not well visualized. EKG: Rhythm strip during this exam demonstrates normal sinus rhythm.  LEFT VENTRICLE PLAX 2D LVIDd:         4.50 cm   Diastology LVIDs:         3.00 cm   LV e' medial:    9.95 cm/s LV PW:         1.00 cm   LV E/e' medial:  7.4 LV IVS:        1.00 cm   LV e' lateral:   15.50 cm/s LVOT diam:     2.10 cm   LV E/e' lateral: 4.8 LV SV:         61 LV SV Index:   29 LVOT Area:     3.46 cm  RIGHT VENTRICLE             IVC RV S prime:     21.80 cm/s  IVC diam: 1.85 cm TAPSE (M-mode): 2.0 cm LEFT ATRIUM           Index        RIGHT ATRIUM  Index LA diam:      4.00 cm 1.92 cm/m   RA Area:     11.60 cm LA Vol (A2C): 29.4 ml 14.06 ml/m  RA Volume:   23.80 ml  11.40 ml/m LA Vol (A4C): 33.6 ml 16.10 ml/m  AORTIC VALVE                 PULMONIC VALVE AV Area (Vmax): 2.91 cm     PR End Diast Vel: 5.76 msec AV  Vmax:        126.00 cm/s AV Peak Grad:   6.4 mmHg LVOT Vmax:      106.00 cm/s LVOT Vmean:     69.750 cm/s LVOT VTI:       0.176 m  AORTA Ao Root diam: 3.60 cm Ao Asc diam:  3.40 cm MITRAL VALVE               TRICUSPID VALVE MV Area (PHT): 5.13 cm    TR Peak grad:   5.0 mmHg MV Decel Time: 148 msec    TR Vmax:        112.00 cm/s MV E velocity: 74.10 cm/s MV A velocity: 70.30 cm/s  SHUNTS MV E/A ratio:  1.05        Systemic VTI:  0.18 m                            Systemic Diam: 2.10 cm Sunit Tolia DO Electronically signed by Tessa Lerner DO Signature Date/Time: 10/22/2022/4:30:28 PM    Final    CARDIAC CATHETERIZATION  Result Date: 10/22/2022 Images from the original result were not included. Coronary angiography 10/22/2022: LM: Normal LAD: Prox diffuse 3040% disease Lcx: No significant disease RCA: Minimal luminal irregularities Coronary physiology testing: RFR: 0.94 FFR: 0.88 CFR: 3.4 IMR: 25 Numbers do not suggest coronary microvascular dysfunction. Conclusion: Mild nonobstructive coronary artery disease No evidence of coronary microvascular dysfunction Elder Negus, MD Pager: [phone] Office: [phone]  CT Angio Chest PE W/Cm &/Or Wo Cm  Result Date: 10/21/2022 CLINICAL DATA:  Shortness of breath.  PE suspected EXAM: CT ANGIOGRAPHY CHEST WITH CONTRAST TECHNIQUE: Multidetector CT imaging of the chest was performed using the standard protocol during bolus administration of intravenous contrast. Multiplanar CT image reconstructions and MIPs were obtained to evaluate the vascular anatomy. RADIATION DOSE REDUCTION: This exam was performed according to the departmental dose-optimization program which includes automated exposure control, adjustment of the mA and/or kV according to patient size and/or use of iterative reconstruction technique. CONTRAST:  75mL OMNIPAQUE IOHEXOL 350 MG/ML SOLN COMPARISON:  Chest radiograph 10/21/2022 and CT chest report from 08/24/2020 FINDINGS: Cardiovascular: Negative for  acute pulmonary embolism. Normal caliber thoracic aorta. No pericardial effusion. Aortic and coronary artery atherosclerotic calcification. Mediastinum/Nodes: Trachea and esophagus are unremarkable. No thoracic adenopathy Lungs/Pleura: No focal consolidation, pleural effusion, or pneumothorax. Upper Abdomen: No acute abnormality. Musculoskeletal: No acute fracture. Review of the MIP images confirms the above findings. IMPRESSION: Negative for acute pulmonary embolism. No acute abnormality in the chest. Aortic Atherosclerosis (ICD10-I70.0). Electronically Signed   By: Minerva Fester M.D.   On: 10/21/2022 18:14   DG Chest 2 View  Result Date: 10/21/2022 CLINICAL DATA:  Scott chest pain that radiates into the left arm and neck EXAM: CHEST - 2 VIEW COMPARISON:  X-ray 09/03/2022 FINDINGS: The heart size and mediastinal contours are within normal limits. No consolidation, pneumothorax or effusion. No edema. The visualized skeletal structures are unremarkable. Mild pectus  excavatum on lateral view. Overlapping cardiac leads. IMPRESSION: No acute cardiopulmonary disease. Electronically Signed   By: Karen Kays M.D.   On: 10/21/2022 17:58      Scheduled Meds:  aspirin EC  81 mg Oral Daily   empagliflozin  10 mg Oral Daily   ezetimibe  10 mg Oral Daily   heparin  5,000 Units Subcutaneous Q8H   icosapent Ethyl  2 g Oral BID   insulin aspart  0-15 Units Subcutaneous TID WC   insulin aspart  0-5 Units Subcutaneous QHS   insulin aspart  5 Units Subcutaneous TID WC   insulin glargine-yfgn  30 Units Subcutaneous QHS   insulin starter kit- pen needles  1 kit Other Once   lisinopril  20 mg Oral Daily   living well with diabetes book   Does not apply Once   sertraline  150 mg Oral Daily   sodium chloride flush  3 mL Intravenous Q12H   sodium chloride flush  3 mL Intravenous Q12H   Continuous Infusions:  sodium chloride     nitroGLYCERIN Stopped (10/22/22 1025)     LOS: 1 day   Time spent: 45 minutes    Noralee Stain, DO [facility] 10/23/2022, 2:25 PM   Available via Epic secure chat 7am-7pm After these hours, please refer to coverage provider listed on [email or web address]

## 2022-10-23 NOTE — Consult Note (Signed)
Neurology Consultation  Reason for Consult: Left leg weakness, fall and altered mental status Referring Physician: Dr. Alvino Chapel  CC: Left leg weakness  History is obtained from: Patient, RN and chart  HPI: Scott Pineda is a 62 y.o. male with history of diabetes, hypertension, remote history of sarcoidosis in remission and hyperlipidemia who was originally admitted for chest pain and underwent cardiac cath without stenting yesterday.  He has been hyperglycemic throughout his admission.  He was last known to be well today at 1230 p.m. although he has not received a full examination since 8:30 AM so has an unclear last known well.Marland Kitchen  He then fell while getting out of bed and was noted to be moving his left side less than his right.  He did strike his head when falling out of bed.  He also had altered mental status and was disoriented to place time and situation.  A code stroke was activated because of his altered mental status.  He was also noted to have some left-sided weakness which also was reason to call the code.   LKW: Unclear-anywhere from this morning prior to 8:30 AM, fall around 12:30 PM. TNK given?: no, no stroke seen on MRI IR Thrombectomy? No, I am not consistent with LVO Modified Rankin Scale: 1-No significant post stroke disability and can perform usual duties with stroke symptoms  ROS: Unable to obtain due to altered mental status.   Past Medical History:  Diagnosis Date   ADVERSE REACTION TO MEDICATION 10/09/2008   Qualifier: Diagnosis of  By: Fabian Sharp MD, Neta Mends    Allergic rhinitis    Asthma    Depression    GERD (gastroesophageal reflux disease)    Gout    Hyperlipidemia    Hypertension    Salivary stone    Trauma    hx of falling trauma and back pain with side effects of medication, parkisonian now resolved     Family History  Adopted: Yes  Family history unknown: Yes     Social History:   reports that he has never smoked. He has never used smokeless tobacco.  He reports that he does not drink alcohol and does not use drugs.  Medications  Current Facility-Administered Medications:    0.9 %  sodium chloride infusion, 250 mL, Intravenous, PRN, Patwardhan, Manish J, MD   acetaminophen (TYLENOL) tablet 650 mg, 650 mg, Oral, Q4H PRN, Patwardhan, Manish J, MD, 650 mg at 10/23/22 0820   albuterol (PROVENTIL) (2.5 MG/3ML) 0.083% nebulizer solution 2.5 mg, 2.5 mg, Nebulization, Q6H PRN, Rexford Maus, RPH   aspirin EC tablet 81 mg, 81 mg, Oral, Daily, Doristine Counter, RPH, 81 mg at 10/23/22 0819   empagliflozin (JARDIANCE) tablet 10 mg, 10 mg, Oral, Daily, Tolia, Sunit, DO, 10 mg at 10/23/22 0820   ezetimibe (ZETIA) tablet 10 mg, 10 mg, Oral, Daily, Tolia, Sunit, DO, 10 mg at 10/23/22 0819   heparin injection 5,000 Units, 5,000 Units, Subcutaneous, Q8H, Patwardhan, Manish J, MD, 5,000 Units at 10/22/22 2215   icosapent Ethyl (VASCEPA) 1 g capsule 2 g, 2 g, Oral, BID, Tolia, Sunit, DO, 2 g at 10/23/22 0819   insulin aspart (novoLOG) injection 0-15 Units, 0-15 Units, Subcutaneous, TID WC, Darreld Mclean R, MD, 8 Units at 10/23/22 1219   insulin aspart (novoLOG) injection 0-5 Units, 0-5 Units, Subcutaneous, QHS, Darreld Mclean R, MD, 5 Units at 10/21/22 2217   insulin aspart (novoLOG) injection 5 Units, 5 Units, Subcutaneous, TID WC, [place], Eric J, DO, 5 Units  at 10/23/22 1220   insulin glargine-yfgn (SEMGLEE) injection 30 Units, 30 Units, Subcutaneous, QHS, Choi, Jennifer, DO   insulin starter kit- pen needles (English) 1 kit, 1 kit, Other, Once, [place], Eric J, DO   lisinopril (ZESTRIL) tablet 20 mg, 20 mg, Oral, Daily, Darreld Mclean R, MD, 20 mg at 10/23/22 1610   living well with diabetes book MISC, , Does not apply, Once, [place], Alvira Philips, DO   nitroGLYCERIN 50 mg in dextrose 5 % 250 mL (0.2 mg/mL) infusion, 0-200 mcg/min, Intravenous, Continuous, Charlynne Pander, MD, Stopped at 10/22/22 1025   ondansetron (ZOFRAN) injection 4 mg, 4 mg, Intravenous,  Q6H PRN, Patwardhan, Manish J, MD   ondansetron (ZOFRAN) tablet 4 mg, 4 mg, Oral, Q6H PRN **OR** [DISCONTINUED] ondansetron (ZOFRAN) injection 4 mg, 4 mg, Intravenous, Q6H PRN, Allena Katz, Vishal R, MD   senna-docusate (Senokot-S) tablet 1 tablet, 1 tablet, Oral, QHS PRN, Charlsie Quest, MD   sertraline (ZOLOFT) tablet 150 mg, 150 mg, Oral, Daily, Allena Katz, Eston Esters R, MD, 150 mg at 10/23/22 0819   sodium chloride flush (NS) 0.9 % injection 3 mL, 3 mL, Intravenous, Q12H, Patel, Vishal R, MD   sodium chloride flush (NS) 0.9 % injection 3 mL, 3 mL, Intravenous, Q12H, Patwardhan, Manish J, MD, 3 mL at 10/22/22 1600   sodium chloride flush (NS) 0.9 % injection 3 mL, 3 mL, Intravenous, PRN, Patwardhan, Manish J, MD   Exam: Current vital signs: BP (!) 155/82   Pulse 94   Temp 98.6 F (37 C) (Oral)   Resp 18   Ht 6' (1.829 m)   Wt 83 kg   SpO2 95%   BMI 24.82 kg/m  Vital signs in last 24 hours: Temp:  [98.1 F (36.7 C)-98.6 F (37 C)] 98.6 F (37 C) (09/05 1143) Pulse Rate:  [76-96] 94 (09/05 1229) Resp:  [16-20] 18 (09/05 1143) BP: (100-173)/(65-97) 155/82 (09/05 1229) SpO2:  [95 %-98 %] 95 % (09/05 1229) Weight:  [83 kg] 83 kg (09/05 0444)  GENERAL: Awake, alert, in no acute distress Psych: Affect appropriate for situation, patient is calm and cooperative with examination Head: Normocephalic and atraumatic, without obvious abnormality EENT: Normal conjunctivae, moist mucous membranes, no OP obstruction LUNGS: Normal respiratory effort. Non-labored breathing on room air CV: Regular rate and rhythm on telemetry Extremities: warm, well perfused, without obvious deformity  NEURO:  Mental Status: Awake, alert, and oriented to person but disoriented to place time and situation He is not able to provide a clear and coherent history of present illness. Speech/Language: speech is clear and fluent.   Naming, repetition, fluency, and comprehension intact without aphasia  No neglect is  noted Cranial Nerves:  II: PERRL visual fields full.  III, IV, VI: EOMI. Lid elevation symmetric and full.  VII: Face is symmetric resting and smiling.  VIII: Hearing intact to voice IX, X: Phonation normal.  XII: Tongue protrudes midline without fasciculations.   Motor: Moves all 4 extremities with antigravity strength but weaker on left than right Tone is normal. Bulk is normal.  Sensation: Intact to light touch bilaterally in all four extremities. No extinction to DSS present.  Coordination: FTN intact bilaterally.  Slight drift in left arm Gait: Deferred  NIHSS: 1a Level of Conscious.: 0 1b LOC Questions: 2 1c LOC Commands: 0 2 Best Gaze: 0  3 Visual: 0 4 Facial Palsy: 0 5a Motor Arm - left: 1 5b Motor Arm - Right: 1 6a Motor Leg - Left: 2 6b Motor Leg -  Right: 1 7 Limb Ataxia: 0 8 Sensory: 0 9 Best Language: 0 10 Dysarthria: 0 11 Extinct. and Inatten.: 0 TOTAL: 7   Labs I have reviewed labs in epic and the results pertinent to this consultation are:   CBC    Component Value Date/Time   WBC 6.8 10/23/2022 0419   RBC 4.85 10/23/2022 0419   HGB 14.5 10/23/2022 0419   HCT 42.8 10/23/2022 0419   PLT 194 10/23/2022 0419   MCV 88.2 10/23/2022 0419   MCH 29.9 10/23/2022 0419   MCHC 33.9 10/23/2022 0419   RDW 13.1 10/23/2022 0419   LYMPHSABS 2,800 05/02/2016 1633   MONOABS 560 05/02/2016 1633   EOSABS 140 05/02/2016 1633   BASOSABS 0 05/02/2016 1633    CMP     Component Value Date/Time   NA 135 10/23/2022 0419   K 3.9 10/23/2022 0419   CL 100 10/23/2022 0419   CO2 25 10/23/2022 0419   GLUCOSE 243 (H) 10/23/2022 0419   BUN 12 10/23/2022 0419   CREATININE 1.02 10/23/2022 0419   CREATININE 0.89 05/02/2016 1633   CALCIUM 9.0 10/23/2022 0419   PROT 7.8 05/02/2016 1633   ALBUMIN 4.6 05/02/2016 1633   AST 28 05/02/2016 1633   ALT 34 05/02/2016 1633   ALKPHOS 43 05/02/2016 1633   BILITOT 0.4 05/02/2016 1633   GFRNONAA >60 10/23/2022 0419   GFRAA 133  05/03/2007 1505    Lipid Panel     Component Value Date/Time   CHOL 227 (H) 10/22/2022 0330   TRIG 345 (H) 10/22/2022 0330   TRIG 249 09/01/2009 0000   HDL 30 (L) 10/22/2022 0330   CHOLHDL 7.6 10/22/2022 0330   VLDL 69 (H) 10/22/2022 0330   LDLCALC 128 (H) 10/22/2022 0330   LDLDIRECT 142.0 05/28/2015 0844     Imaging I have reviewed the images obtained:  CT-scan of the brain: No acute abnormality  MRI examination of the brain: No evidence of acute infarct  Assessment: 62 year old patient with history of hypertension, hyperlipidemia, remote history of sarcoidosis in remission and diabetes scented originally with chest pain and underwent cardiac cath yesterday.  He was on a heparin and nitroglycerin drip then, but they have since been discontinued.  Today, shortly after 1230, he was attempting to get out of bed fell, struck his head and was noted to have altered mental status as well as generalized weakness which was worse on the left.  Unclear last known well because of his head to toe examination was done sometime prior to 8:30 AM. He has been hyperglycemic throughout his hospitalization, and antidiabetic meds are being adjusted.  CT head was negative for acute abnormality, and given unclear last known well, stat MRI of the brain was performed-MRI showed no acute infarct.  Source of patient's confusion and generalized weakness is likely toxic metabolic encephalopathy in the setting of hyperglycemia.  He will need workup for sources of infection and toxic metabolic encephalopathy.  Review of medication administration record showed no administration of medications today which could have caused confusion.  Impression: Altered mental status and generalized weakness likely due to toxic metabolic encephalopathy  Recommendations: -CMP and ammonia -Urinalysis to detect UTI -Routine EEG. -Further workup of toxic metabolic encephalopathy per primary team  Please call back neurology if any of  the above are unremarkable and/or his examination does not improve with time.  Pt seen by NP/Neuro and later by MD. Note/plan to be edited by MD as needed.  Cortney E Ernestina Columbia ,  MSN, AGACNP-BC [facility] See Amion for schedule and pager information 10/23/2022 1:58 PM    Attending Neurohospitalist Addendum Patient seen and examined with APP/Resident. Agree with the history and physical as documented above. Agree with the plan as documented, which I helped formulate. I have independently reviewed the chart, obtained history, review of systems and examined the patient.I have personally reviewed pertinent head/neck/spine imaging (CT/MRI).  Plan was relayed to Dr. Alvino Chapel Please feel free to call with any questions.  -- Milon Dikes, MD Neurologist [facility] Pager: [phone]  CRITICAL CARE ATTESTATION Performed by: Milon Dikes, MD Total critical care time: 40 minutes Critical care time was exclusive of separately billable procedures and treating other patients and/or supervising APPs/Residents/Students Critical care was necessary to treat or prevent imminent or life-threatening deterioration. This patient is critically ill and at significant risk for neurological worsening and/or death and care requires constant monitoring. Critical care was time spent personally by me on the following activities: development of treatment plan with patient and/or surrogate as well as nursing, discussions with consultants, evaluation of patient's response to treatment, examination of patient, obtaining history from patient or surrogate, ordering and performing treatments and interventions, ordering and review of laboratory studies, ordering and review of radiographic studies, pulse oximetry, re-evaluation of patient's condition, participation in multidisciplinary rounds and medical decision making of high complexity in the care of this patient.

## 2022-10-23 NOTE — Inpatient Diabetes Management (Signed)
Inpatient Diabetes Program Recommendations  AACE/ADA: New Consensus Statement on Inpatient Glycemic Control (2015)  Target Ranges:  Prepandial:   less than 140 mg/dL      Peak postprandial:   less than 180 mg/dL (1-2 hours)      Critically ill patients:  140 - 180 mg/dL   Lab Results  Component Value Date   GLUCAP 242 (H) 10/23/2022   HGBA1C 13.3 (H) 10/22/2022    Latest Reference Range & Units 10/22/22 07:43 10/22/22 09:29 10/22/22 16:27 10/22/22 21:15 10/23/22 08:07  Glucose-Capillary 70 - 99 mg/dL 952 (H) 841 (H) 324 (H) 169 (H) 242 (H)  (H): Data is abnormally high  Diabetes history: DM2 Outpatient Diabetes medications: Semglee 25, glipizide 5 mg, and Farxiga 5 mg  Current orders for Inpatient glycemic control: Semglee 25 units, Novolog 5 units tid meal coverage, Novolog 0-15 units tid, 0-5 units hs correction  Inpatient Diabetes Program Recommendations:   Fasting CBG 242. Please consider: -Increase Semglee to 30 units  Thank you, Scott Pineda. Scott Apostol, RN, MSN, CDE  Diabetes Coordinator Inpatient Glycemic Control Team Team Pager [phone] (8am-5pm) 10/23/2022 11:19 AM

## 2022-10-24 DIAGNOSIS — R569 Unspecified convulsions: Secondary | ICD-10-CM

## 2022-10-24 DIAGNOSIS — R4182 Altered mental status, unspecified: Secondary | ICD-10-CM | POA: Diagnosis not present

## 2022-10-24 DIAGNOSIS — R079 Chest pain, unspecified: Secondary | ICD-10-CM | POA: Diagnosis not present

## 2022-10-24 LAB — GLUCOSE, CAPILLARY
Glucose-Capillary: 290 mg/dL — ABNORMAL HIGH (ref 70–99)
Glucose-Capillary: 320 mg/dL — ABNORMAL HIGH (ref 70–99)
Glucose-Capillary: 381 mg/dL — ABNORMAL HIGH (ref 70–99)
Glucose-Capillary: 179 mg/dL — ABNORMAL HIGH (ref 70–99)
Glucose-Capillary: 283 mg/dL — ABNORMAL HIGH (ref 70–99)

## 2022-10-24 LAB — LIPOPROTEIN A (LPA): Lipoprotein (a): <8.4 nmol/L (ref <75.0)

## 2022-10-24 MED ORDER — INSULIN GLARGINE-YFGN 100 UNIT/ML ~~LOC~~ SOLN
35.0000 [IU] | Freq: Every day | SUBCUTANEOUS | Status: DC
  Administered 2022-10-25 (×2): 35 [IU] via SUBCUTANEOUS
  Filled 2022-10-24 (×3): qty 0.35

## 2022-10-24 MED ORDER — LIVING WELL WITH DIABETES BOOK
Freq: Once | Status: AC
  Filled 2022-10-24: qty 1

## 2022-10-24 MED ORDER — INSULIN ASPART 100 UNIT/ML IJ SOLN
8.0000 [IU] | Freq: Three times a day (TID) | INTRAMUSCULAR | Status: DC
  Administered 2022-10-24 – 2022-10-26 (×6): 8 [IU] via SUBCUTANEOUS

## 2022-10-24 NOTE — Progress Notes (Addendum)
PROGRESS NOTE    Scott Pineda  YQI:[id number] DOB: 02-17-1961 DOA: 10/21/2022 PCP: [facility]     Brief Narrative:  Scott Pineda is a 62 y.o. male with past medical history significant for  insulin-dependent T2DM, HTN, HLD, asthma, remote history of sarcoidosis in remission, depression/anxiety who presented to the ED for evaluation of chest pain.  Patient states that he has been having chest pain on and off for the last 2 months.  Pain is described as sharp and sometimes pressure-like sensation.  It occurs in the midsternal to left chest area with radiation down his left arm and up his neck.  It may occur with rest.  Sometimes it is associated with dyspnea and diaphoresis. Patient states that this morning chest pain returned and was very severe.  He also reports cough with deep inspiration.  He denies any known personal cardiac history.  He is adopted and does not know his biological family history.  He is a never smoker but states that he was exposed to smoke in the house growing up.   He underwent nuclear stress testing recently 10/07/2022 in [place] which did not show reversible ischemia or infarction.  Normal LV wall motion, EF 60%.   Patient reports severe intolerance to statin which caused significant myopathy about 12 years ago.  He is not currently on aspirin.  Blood sugars have been poorly controlled recently.  He states that he stopped taking metformin due to GI intolerance and is now on insulin glargine 25 units every evening in addition to glipizide and [place].   In the ED, BP 159/104, pulse 110, RR 20, temp 97.8 F, SpO2 97% on room air. Sodium 130 (139 when corrected for hyperglycemia), potassium 3.9, bicarb 20, BUN 9, creatinine 0.81, serum glucose 479, WBC 7.7, hemoglobin 16.1, platelets 221,000, troponin 7 > 8, beta hydroxybutyrate 1.40. CTA chest negative for PE or acute abnormality.  Aortic atherosclerosis noted. Patient was given 10 units subcutaneous NovoLog, 1 L  normal saline, IV morphine 4 mg x 2.  EDP spoke with on-call [facility] cardiology Dr. Odis Hollingshead who recommended starting on IV heparin, medical admission, and potential cardiac cath in the morning.  Patient was started on IV nitroglycerin infusion.  The hospitalist service was consulted to admit for further evaluation and management.  Patient underwent heart cath 9/4, which revealed mild nonobstructive coronary artery disease.  On 9/5, patient suffered witnessed fall in his room after getting out of bed.  He hit his head, had some confusion and left-sided weakness.  Code stroke was activated.  CT head and MRI brain were unremarkable.   New events last 24 hours / Subjective: Patient feeling well, in good spirits today.  Has headache.  Worked with physical therapy today, had some generalized weakness, impaired balance and decreased activity tolerance.  Orthostatic vital sign was negative.  Assessment & Plan:  Principal Problem:   Chest pain Active Problems:   Type 2 diabetes mellitus with hyperglycemia, with long-term current use of insulin (HCC)   Hypertension associated with diabetes (HCC)   Hyperlipidemia associated with type 2 diabetes mellitus (HCC)   Depression   Asthma   Statin intolerance   Unstable angina (HCC)   Pure hypertriglyceridemia    Unstable angina Patient presenting with continued chest discomfort, ongoing for 2 months.  Localized to his substernal region with radiation to his left arm/jaw.  He has had recent normal stress test at OSH.  Troponin is negative x 2 and EKG without acute ischemic changes.   --  Status post heart cath 9/4: Mild nonobstructive coronary artery disease --Echocardiogram with EF 60 to 65%, no regional wall motion abnormality, normal LV diastolic parameter -- Aspirin  Acute metabolic encephalopathy after fall and trauma to his head -Stat CT head negative for acute abnormality -MRI brain negative -EEG negative -Appreciate neurology -Orthostatic vital  sign negative -PT evaluation -Resolved   Type 2 diabetes mellitus with hyperglycemia, with long-term current use of insulin  Hemoglobin A1c 13.3, poorly controlled.  Recently stopped metformin due to GI side effect.  Home regimen includes Semglee 25 units subcutaneously nightly, glipizide 5 mg p.o. daily and Farxiga. -- Diabetic educator consult -- Semglee, NovoLog, sliding scale insulin.  Dose adjusted today due to hyperglycemia.  Asked diabetic coordinator for extra glucose monitor sensor   Hypertension associated with diabetes  -- Lisinopril   Hyperlipidemia associated with type 2 diabetes mellitus  -- Vascepa    Asthma -- Albuterol neb every 6 hours as needed wheezing/shortness of breath   Depression -- Zoloft  DVT prophylaxis:  heparin injection 5,000 Units Start: 10/22/22 2200 SCD's Start: 10/22/22 1157  Code Status: Full Family Communication: Wife Disposition Plan: Home Status is: Observation The patient will require care spanning > 2 midnights and should be moved to inpatient because: Need further workup, PT evaluation and stabilization before discharge home    Antimicrobials:  Anti-infectives (From admission, onward)    None        Objective: Vitals:   10/23/22 1951 10/23/22 2350 10/24/22 0427 10/24/22 0832  BP: 128/84 118/80 118/79 105/79  Pulse: 84 73 69 81  Resp: 20 18 16 16   Temp: 98.4 F (36.9 C) 98.3 F (36.8 C) 98.3 F (36.8 C) 98.4 F (36.9 C)  TempSrc: Oral Oral Oral Oral  SpO2:  93% 96% 94%  Weight:   82.6 kg   Height:       No intake or output data in the 24 hours ending 10/24/22 1246  Filed Weights   10/21/22 2036 10/23/22 0444 10/24/22 0427  Weight: 86.4 kg 83 kg 82.6 kg    Examination:  General exam: Appears calm and comfortable  Respiratory system: Clear to auscultation. Respiratory effort normal. No respiratory distress. No conversational dyspnea.  Cardiovascular system: S1 & S2 heard, RRR. No murmurs. No pedal  edema. Gastrointestinal system: Abdomen is nondistended, soft and nontender. Normal bowel sounds heard. Central nervous system: Alert, oriented, without focal deficits noted, speech is clear Extremities: Symmetric in appearance  Skin: No rashes, lesions or ulcers on exposed skin  Psychiatry: Judgement and insight appear normal. Mood & affect appropriate.   Data Reviewed: I have personally reviewed following labs and imaging studies  CBC: Recent Labs  Lab 10/21/22 1349 10/22/22 0330 10/23/22 0419  WBC 7.7 7.4 6.8  HGB 16.1 13.5 14.5  HCT 45.5 39.7 42.8  MCV 88.0 87.4 88.2  PLT 221 183 194   Basic Metabolic Panel: Recent Labs  Lab 10/21/22 1349 10/22/22 0330 10/23/22 0419 10/23/22 1431  NA 130* 135 135 138  K 3.9 3.1* 3.9 3.9  CL 95* 100 100 98  CO2 20* 21* 25 23  GLUCOSE 479* 274* 243* 257*  BUN 9 7* 12 15  CREATININE 0.81 0.78 1.02 1.27*  CALCIUM 9.3 8.1* 9.0 9.4  MG  --  1.9 2.0  --    GFR: Estimated Creatinine Clearance: 66.2 mL/min (A) (by C-G formula based on SCr of 1.27 mg/dL (H)). Liver Function Tests: Recent Labs  Lab 10/23/22 1431  AST 18  ALT 17  ALKPHOS 48  BILITOT 0.9  PROT 6.6  ALBUMIN 3.7   No results for input(s): "LIPASE", "AMYLASE" in the last 168 hours. Recent Labs  Lab 10/23/22 1431  AMMONIA 33   Coagulation Profile: No results for input(s): "INR", "PROTIME" in the last 168 hours. Cardiac Enzymes: No results for input(s): "CKTOTAL", "CKMB", "CKMBINDEX", "TROPONINI" in the last 168 hours. BNP (last 3 results) No results for input(s): "PROBNP" in the last 8760 hours. HbA1C: Recent Labs    10/22/22 0330  HGBA1C 13.3*   CBG: Recent Labs  Lab 10/23/22 1142 10/23/22 1314 10/23/22 1828 10/23/22 2035 11-17-22 0828  GLUCAP 271* 340* 383* 267* 290*   Lipid Profile: Recent Labs    10/22/22 0330  CHOL 227*  HDL 30*  LDLCALC 128*  TRIG 345*  CHOLHDL 7.6   Thyroid Function Tests: No results for input(s): "TSH", "T4TOTAL",  "FREET4", "T3FREE", "THYROIDAB" in the last 72 hours. Anemia Panel: No results for input(s): "VITAMINB12", "FOLATE", "FERRITIN", "TIBC", "IRON", "RETICCTPCT" in the last 72 hours. Sepsis Labs: No results for input(s): "PROCALCITON", "LATICACIDVEN" in the last 168 hours.  No results found for this or any previous visit (from the past 240 hour(s)).    Radiology Studies: EEG adult  Result Date: 11-17-22 Scott Quest, MD     11-17-2022  8:37 AM Patient Name: Scott Pineda MRN: [id number] Epilepsy Attending: Charlsie Pineda Referring Physician/Provider: Milon Dikes, MD Date: 10/22/2052 Duration: 27.01 mins Patient history: 62yo M with ams getting eeg to evaluate for seizure Level of alertness: Awake AEDs during EEG study: None Technical aspects: This EEG study was done with scalp electrodes positioned according to the 10-20 International system of electrode placement. Electrical activity was reviewed with band pass filter of 1-70Hz , sensitivity of 7 uV/mm, display speed of 49mm/sec with a 60Hz  notched filter applied as appropriate. EEG data were recorded continuously and digitally stored.  Video monitoring was available and reviewed as appropriate. Description: The posterior dominant rhythm consists of 8-9 Hz activity of moderate voltage (25-35 uV) seen predominantly in posterior head regions, symmetric and reactive to eye opening and eye closing. Drowsiness was characterized by attenuation of the posterior background rhythm. Physiologic photic driving was seen during photic stimulation.  Hyperventilation was not performed.   IMPRESSION: This study is within normal limits. No seizures or epileptiform discharges were seen throughout the recording. A normal interictal EEG does not exclude the diagnosis of epilepsy. Scott Pineda   MR BRAIN WO CONTRAST  Result Date: 10/23/2022 CLINICAL DATA:  Neuro deficit, acute, stroke suspected EXAM: MRI HEAD WITHOUT CONTRAST TECHNIQUE: Multiplanar, multiecho  pulse sequences of the brain and surrounding structures were obtained without intravenous contrast. COMPARISON:  CT head 10/23/2022. FINDINGS: Brain: No acute infarction, hemorrhage, hydrocephalus, extra-axial collection or mass lesion. Vascular: Major arterial flow voids are maintained at the skull base. Skull and upper cervical spine: Major arterial flow voids are maintained at the skull base. Sinuses/Orbits: Negative. Other: No mastoid effusions. IMPRESSION: No evidence of acute intracranial abnormality. Electronically Signed   By: Feliberto Harts M.D.   On: 10/23/2022 15:28   CT HEAD CODE STROKE WO CONTRAST`  Result Date: 10/23/2022 CLINICAL DATA:  Code stroke. Head trauma, coagulopathy (Age 21-64y) fall. Left-sided weakness and drowsiness. EXAM: CT HEAD WITHOUT CONTRAST TECHNIQUE: Contiguous axial images were obtained from the base of the skull through the vertex without intravenous contrast. RADIATION DOSE REDUCTION: This exam was performed according to the departmental dose-optimization program which includes automated exposure control, adjustment of the mA  and/or kV according to patient size and/or use of iterative reconstruction technique. COMPARISON:  CT head April 25, 2005 FINDINGS: Brain: No evidence of acute infarction, hemorrhage, hydrocephalus, extra-axial collection or mass lesion/mass effect. Vascular: No hyperdense vessel identified. Skull: No acute fracture. Sinuses/Orbits: Mostly clear sinuses.  No acute orbital findings. Other: No mastoid effusions. ASPECTS [facility] Stroke Program Early CT Score) Total score (0-10 with 10 being normal): 10. IMPRESSION: 1. No evidence of acute intracranial abnormality. 2. ASPECTS is 10. Code stroke imaging results were communicated on 10/23/2022 at 1:13 pm to provider Dr. Wilford Corner via secure text paging. Electronically Signed   By: Feliberto Harts M.D.   On: 10/23/2022 13:14   ECHOCARDIOGRAM COMPLETE  Result Date: 10/22/2022    ECHOCARDIOGRAM REPORT   Patient  Name:   Scott Pineda Date of Exam: 10/22/2022 Medical Rec #:  [id number]       Height:       72.0 in Accession #:    [id number]      Weight:       190.5 lb Date of Birth:  14-Aug-1960      BSA:          2.087 m Patient Age:    61 years        BP:           135/84 mmHg Patient Gender: M               HR:           85 bpm. Exam Location:  Inpatient Procedure: 2D Echo, Cardiac Doppler and Color Doppler Indications:    Chest Pain R07.9  History:        Patient has no prior history of Echocardiogram examinations.                 Angina, Signs/Symptoms:Chest Pain; Risk Factors:Hypertension,                 Diabetes and Dyslipidemia.  Sonographer:    Lucendia Herrlich Referring Phys: [id number] VISHAL R PATEL IMPRESSIONS  1. Left ventricular ejection fraction, by estimation, is 60 to 65%. The left ventricle has normal function. The left ventricle has no regional wall motion abnormalities. Left ventricular diastolic parameters were normal.  2. Right ventricular systolic function is normal. The right ventricular size is normal. There is normal pulmonary artery systolic pressure.  3. The mitral valve is normal in structure. No evidence of mitral valve regurgitation. No evidence of mitral stenosis.  4. The aortic valve is tricuspid. Aortic valve regurgitation is trivial. No aortic stenosis is present.  5. The inferior vena cava is normal in size with greater than 50% respiratory variability, suggesting right atrial pressure of 3 mmHg.  6. Rhythm strip during this exam demonstrates normal sinus rhythm. Comparison(s): No prior Echocardiogram. FINDINGS  Left Ventricle: Left ventricular ejection fraction, by estimation, is 60 to 65%. The left ventricle has normal function. The left ventricle has no regional wall motion abnormalities. The left ventricular internal cavity size was normal in size. There is  no left ventricular hypertrophy. Left ventricular diastolic parameters were normal. Normal left ventricular filling pressure. Right  Ventricle: The right ventricular size is normal. No increase in right ventricular wall thickness. Right ventricular systolic function is normal. There is normal pulmonary artery systolic pressure. The tricuspid regurgitant velocity is 1.12 m/s, and  with an assumed right atrial pressure of 3 mmHg, the estimated right ventricular systolic pressure is 8.0 mmHg. Left Atrium: Left atrial size was  normal in size. Right Atrium: Right atrial size was normal in size. Pericardium: There is no evidence of pericardial effusion. Mitral Valve: The mitral valve is normal in structure. No evidence of mitral valve regurgitation. No evidence of mitral valve stenosis. Tricuspid Valve: The tricuspid valve is grossly normal. Tricuspid valve regurgitation is trivial. No evidence of tricuspid stenosis. Aortic Valve: The aortic valve is tricuspid. Aortic valve regurgitation is trivial. No aortic stenosis is present. Aortic valve peak gradient measures 6.4 mmHg. Pulmonic Valve: The pulmonic valve was normal in structure. Pulmonic valve regurgitation is not visualized. No evidence of pulmonic stenosis. Aorta: The aortic root and ascending aorta are structurally normal, with no evidence of dilitation. Venous: The inferior vena cava is normal in size with greater than 50% respiratory variability, suggesting right atrial pressure of 3 mmHg. IAS/Shunts: The interatrial septum was not well visualized. EKG: Rhythm strip during this exam demonstrates normal sinus rhythm.  LEFT VENTRICLE PLAX 2D LVIDd:         4.50 cm   Diastology LVIDs:         3.00 cm   LV e' medial:    9.95 cm/s LV PW:         1.00 cm   LV E/e' medial:  7.4 LV IVS:        1.00 cm   LV e' lateral:   15.50 cm/s LVOT diam:     2.10 cm   LV E/e' lateral: 4.8 LV SV:         61 LV SV Index:   29 LVOT Area:     3.46 cm  RIGHT VENTRICLE             IVC RV S prime:     21.80 cm/s  IVC diam: 1.85 cm TAPSE (M-mode): 2.0 cm LEFT ATRIUM           Index        RIGHT ATRIUM           Index LA  diam:      4.00 cm 1.92 cm/m   RA Area:     11.60 cm LA Vol (A2C): 29.4 ml 14.06 ml/m  RA Volume:   23.80 ml  11.40 ml/m LA Vol (A4C): 33.6 ml 16.10 ml/m  AORTIC VALVE                 PULMONIC VALVE AV Area (Vmax): 2.91 cm     PR End Diast Vel: 5.76 msec AV Vmax:        126.00 cm/s AV Peak Grad:   6.4 mmHg LVOT Vmax:      106.00 cm/s LVOT Vmean:     69.750 cm/s LVOT VTI:       0.176 m  AORTA Ao Root diam: 3.60 cm Ao Asc diam:  3.40 cm MITRAL VALVE               TRICUSPID VALVE MV Area (PHT): 5.13 cm    TR Peak grad:   5.0 mmHg MV Decel Time: 148 msec    TR Vmax:        112.00 cm/s MV E velocity: 74.10 cm/s MV A velocity: 70.30 cm/s  SHUNTS MV E/A ratio:  1.05        Systemic VTI:  0.18 m                            Systemic Diam: 2.10 cm Sunit Tolia DO Electronically signed  by Tessa Lerner DO Signature Date/Time: 10/22/2022/4:30:28 PM    Final       Scheduled Meds:  aspirin EC  81 mg Oral Daily   empagliflozin  10 mg Oral Daily   ezetimibe  10 mg Oral Daily   heparin  5,000 Units Subcutaneous Q8H   icosapent Ethyl  2 g Oral BID   insulin aspart  0-15 Units Subcutaneous TID WC   insulin aspart  0-5 Units Subcutaneous QHS   insulin aspart  8 Units Subcutaneous TID WC   insulin glargine-yfgn  35 Units Subcutaneous QHS   insulin starter kit- pen needles  1 kit Other Once   lisinopril  20 mg Oral Daily   living well with diabetes book   Does not apply Once   living well with diabetes book   Does not apply Once   sertraline  150 mg Oral Daily   sodium chloride flush  3 mL Intravenous Q12H   sodium chloride flush  3 mL Intravenous Q12H   Continuous Infusions:  sodium chloride       LOS: 2 days   Time spent: 25 minutes   Noralee Stain, DO [facility] 10/24/2022, 12:46 PM   Available via Epic secure chat 7am-7pm After these hours, please refer to coverage provider listed on [email or web address]

## 2022-10-24 NOTE — Procedures (Signed)
Patient Name: Scott Pineda  MRN: [id number]  Epilepsy Attending: Charlsie Quest  Referring Physician/Provider: Milon Dikes, MD  Date: 10/22/2052 Duration: 27.01 mins  Patient history: 62yo M with ams getting eeg to evaluate for seizure  Level of alertness: Awake  AEDs during EEG study: None  Technical aspects: This EEG study was done with scalp electrodes positioned according to the 10-20 International system of electrode placement. Electrical activity was reviewed with band pass filter of 1-70Hz , sensitivity of 7 uV/mm, display speed of 73mm/sec with a 60Hz  notched filter applied as appropriate. EEG data were recorded continuously and digitally stored.  Video monitoring was available and reviewed as appropriate.  Description: The posterior dominant rhythm consists of 8-9 Hz activity of moderate voltage (25-35 uV) seen predominantly in posterior head regions, symmetric and reactive to eye opening and eye closing. Drowsiness was characterized by attenuation of the posterior background rhythm. Physiologic photic driving was seen during photic stimulation.  Hyperventilation was not performed.     IMPRESSION: This study is within normal limits. No seizures or epileptiform discharges were seen throughout the recording.  A normal interictal EEG does not exclude the diagnosis of epilepsy.  Teryn Gust Annabelle Harman

## 2022-10-24 NOTE — Inpatient Diabetes Management (Addendum)
Inpatient Diabetes Program Recommendations  AACE/ADA: New Consensus Statement on Inpatient Glycemic Control (2015)  Target Ranges:  Prepandial:   less than 140 mg/dL      Peak postprandial:   less than 180 mg/dL (1-2 hours)      Critically ill patients:  140 - 180 mg/dL   Lab Results  Component Value Date   GLUCAP 290 (H) 10/24/2022   HGBA1C 13.3 (H) 10/22/2022    Latest Reference Range & Units 10/23/22 08:07 10/23/22 11:42 10/23/22 13:14 10/23/22 18:28 10/23/22 20:35 10/24/22 08:28  Glucose-Capillary 70 - 99 mg/dL 213 (H) 086 (H) 578 (H) 383 (H) 267 (H) 290 (H)  (H): Data is abnormally high  Diabetes history: DM2 Outpatient Diabetes medications: Semglee 25, glipizide 5 mg, and Farxiga 5 mg  Current orders for Inpatient glycemic control: Semglee 30 units, Novolog 5 units tid meal coverage, Novolog 0-15 units tid, 0-5 units hs correction  Inpatient Diabetes Program Recommendations:   Noted CBGs continue elevated. Please consider: -Increase Semglee to 35 units -Increase Novolog meal coverage to 8 units tid -Add Carb mod to diet 11:15 am Patient took glucose sensor off due to having MRI yesterday. Gave patient 3 additional CGM sensors for home use.  Thank you, Scott Pineda. Scott Mcbryar, RN, MSN, CDE  Diabetes Coordinator Inpatient Glycemic Control Team Team Pager [phone] (8am-5pm) 10/24/2022 10:51 AM

## 2022-10-24 NOTE — Evaluation (Signed)
Physical Therapy Evaluation Patient Details Name: Scott Pineda MRN: [id number] DOB: May 12, 1960 Today's Date: 10/24/2022  History of Present Illness  62 yo male presents to [facility] on 9/3 with L-sided chest pain radiating down L neck and arm, SOB. S/p LHC on 9/4 showing mild nonobstructive CAD, no evidence of coronary microvascular dysfunction. Fall OOB on 9/5 due to LLE weakness, code stroke with MRI and head CT negative for acute abnormality. Nuclear stress test on 8/20 OP which ddi not show reversible ischemia or infarction, did show normal LV wall motion and EF of 60%. PMH includes HTN, HLD, DM, GERD, sarcoidosis in remission.  Clinical Impression   Pt presents with generalized weakness, headache type pain, impaired balance with history of falls, and decreased activity tolerance. Pt to benefit from acute PT to address deficits. Pt ambulated short room distance with use of RW and assist to steady throughout as pt had x3 posterior LOB. Pt also reporting lightheadedness throughout session, BP in standing at 0 and 3 minutes 118/79 and 139/99 respectively. Pt describes lightheadedness, light sensitivity, headache, difficulty concentrating consistent with concussion s/p fall. PT educated pt on concussion protocol and administered handout, pt and family express understanding. PT to progress mobility as tolerated, and will continue to follow acutely.          If plan is discharge home, recommend the following: A little help with walking and/or transfers;A little help with bathing/dressing/bathroom   Can travel by private vehicle        Equipment Recommendations Rolling walker (2 wheels)  Recommendations for Other Services       Functional Status Assessment Patient has had a recent decline in their functional status and demonstrates the ability to make significant improvements in function in a reasonable and predictable amount of time.     Precautions / Restrictions Precautions Precautions:  Fall Restrictions Weight Bearing Restrictions: No      Mobility  Bed Mobility Overal bed mobility: Needs Assistance Bed Mobility: Supine to Sit, Sit to Supine     Supine to sit: Supervision Sit to supine: Supervision   General bed mobility comments: use of bedrails and HOB elevation    Transfers Overall transfer level: Needs assistance Equipment used: Rolling walker (2 wheels) Transfers: Sit to/from Stand Sit to Stand: Min assist           General transfer comment: assist to rise and steady, x2 stand attempts before successful    Ambulation/Gait Ambulation/Gait assistance: Min assist Gait Distance (Feet): 30 Feet Assistive device: Rolling walker (2 wheels) Gait Pattern/deviations: Step-through pattern, Decreased stride length, Trunk flexed Gait velocity: decr     General Gait Details: assist to steady, x3 posterior LOB requiring PT assist to correct. VSS. cues for widening BOS  Stairs            Wheelchair Mobility     Tilt Bed    Modified Rankin (Stroke Patients Only)       Balance Overall balance assessment: History of Falls, Needs assistance Sitting-balance support: No upper extremity supported, Feet supported Sitting balance-Leahy Scale: Fair     Standing balance support: No upper extremity supported, During functional activity Standing balance-Leahy Scale: Poor                               Pertinent Vitals/Pain Pain Assessment Pain Assessment: Faces Faces Pain Scale: Hurts little more Pain Location: head Pain Descriptors / Indicators: Headache Pain Intervention(s): Limited activity within patient's  tolerance, Monitored during session, Repositioned    Home Living Family/patient expects to be discharged to:: Private residence Living Arrangements: Spouse/significant other Available Help at Discharge: Family Type of Home: House Home Access: Stairs to enter   Secretary/administrator of Steps: 2   Home Layout: One  level Home Equipment: Shower seat      Prior Function Prior Level of Function : Independent/Modified Independent             Mobility Comments: works as a Designer, multimedia Extremity Assessment: Defer to OT evaluation    Lower Extremity Assessment Lower Extremity Assessment: Generalized weakness    Cervical / Trunk Assessment Cervical / Trunk Assessment: Normal  Communication      Cognition Arousal: Alert Behavior During Therapy: Anxious Overall Cognitive Status: Within Functional Limits for tasks assessed                                 General Comments: history of anxiety        General Comments      Exercises Other Exercises Other Exercises: pt describes lightheadedness, light sensitivity, headache, difficulty concentrating consistent with concussion s/p fall. PT educated pt on concussion protocol and administered handout, pt and family express understanding   Assessment/Plan    PT Assessment Patient needs continued PT services  PT Problem List Decreased strength;Decreased mobility;Decreased activity tolerance;Decreased balance;Decreased knowledge of use of DME;Decreased safety awareness;Pain       PT Treatment Interventions DME instruction;Therapeutic activities;Gait training;Patient/family education;Therapeutic exercise;Balance training;Stair training;Functional mobility training;Neuromuscular re-education    PT Goals (Current goals can be found in the Care Plan section)  Acute Rehab PT Goals PT Goal Formulation: With patient Time For Goal Achievement: 11/07/22 Potential to Achieve Goals: Good    Frequency Min 1X/week     Co-evaluation               AM-PAC PT "6 Clicks" Mobility  Outcome Measure Help needed turning from your back to your side while in a flat bed without using bedrails?: A Little Help needed moving from lying on your back to sitting on the side  of a flat bed without using bedrails?: A Little Help needed moving to and from a bed to a chair (including a wheelchair)?: A Little Help needed standing up from a chair using your arms (e.g., wheelchair or bedside chair)?: A Little Help needed to walk in hospital room?: A Little Help needed climbing 3-5 steps with a railing? : A Lot 6 Click Score: 17    End of Session Equipment Utilized During Treatment: Gait belt Activity Tolerance: Patient tolerated treatment well Patient left: in bed;with call bell/phone within reach;with bed alarm set;with family/visitor present Nurse Communication: Mobility status PT Visit Diagnosis: Other abnormalities of gait and mobility (R26.89);Muscle weakness (generalized) (M62.81)    Time: 1610-9604 PT Time Calculation (min) (ACUTE ONLY): 28 min   Charges:   PT Evaluation $PT Eval Low Complexity: 1 Low PT Treatments $Therapeutic Activity: 8-22 mins PT General Charges $$ ACUTE PT VISIT: 1 Visit         Marye Round, PT DPT Acute Rehabilitation Services Secure Chat Preferred  Office [phone]   Sausha Raymond E Stroup 10/24/2022, 11:00 AM

## 2022-10-24 NOTE — Progress Notes (Signed)
Mobility Specialist Progress Note:    10/24/22 1433  Mobility  Activity Ambulated with assistance in room  Level of Assistance Contact guard assist, steadying assist  Assistive Device Front wheel walker  Distance Ambulated (ft) 30 ft  Activity Response Tolerated well  Mobility Referral Yes  $Mobility charge 1 Mobility  Mobility Specialist Start Time (ACUTE ONLY) 1410  Mobility Specialist Stop Time (ACUTE ONLY) 1423  Mobility Specialist Time Calculation (min) (ACUTE ONLY) 13 min   Pt received in BR, agreeable to ambulate. Pt needed a contact guard during ambulation d/t slight dizziness and needing x1 correction for posterior lean. States he is feeling much better this afternoon. Returned to bed w/ call bell and personal belongings in reach. Bed alarm on.  Thompson Grayer Mobility Specialist  Please contact vis Secure Chat or  Rehab Office [phone]

## 2022-10-24 NOTE — Progress Notes (Signed)
   10/23/22 1208  What Happened  Was fall witnessed? No  Was patient injured? Yes  Patient found on floor  Found by Staff-comment  Stated prior activity ambulating-unassisted  Provider Notification  Provider Name/Title Dr. Alvino Chapel  Date Provider Notified 10/23/22  Time Provider Notified 1212  Method of Notification Page  Notification Reason Fall  Provider response See new orders;En route  Date of Provider Response 10/23/22  Time of Provider Response 1215  Follow Up  Family notified Yes - comment  Time family notified 1208  Progress note created (see row info) Yes  Adult Fall Risk Assessment  Risk Factor Category (scoring not indicated) High fall risk per protocol (document High fall risk);Fall has occurred during this admission (document High fall risk)  Age 63  Fall History: Fall within 6 months prior to admission 5  Elimination; Bowel and/or Urine Incontinence 0  Elimination; Bowel and/or Urine Urgency/Frequency 0  Medications: includes PCA/Opiates, Anti-convulsants, Anti-hypertensives, Diuretics, Hypnotics, Laxatives, Sedatives, and Psychotropics 3  Patient Care Equipment 2  Mobility-Assistance 2  Mobility-Gait 2  Mobility-Sensory Deficit 0  Altered awareness of immediate physical environment 0  Impulsiveness 2  Lack of understanding of one's physical/cognitive limitations 0  Total Score 17  Patient Fall Risk Level High fall risk  Adult Fall Risk Interventions  Required Bundle Interventions *See Row Information* High fall risk - low, moderate, and high requirements implemented  Additional Interventions Use of appropriate toileting equipment (bedpan, BSC, etc.);PT/OT need assessed if change in mobility from baseline  Screening for Fall Injury Risk (To be completed on HIGH fall risk patients) - Assessing Need for Floor Mats  Risk For Fall Injury- Criteria for Floor Mats Noncompliant with safety precautions  Will Implement Floor Mats Yes  Vitals  BP (!) 164/92  MAP (mmHg) 112   BP Location Left Arm  BP Method Automatic  Patient Position (if appropriate) Sitting  Pulse Rate 96  Oxygen Therapy  SpO2 95 %  O2 Device Room Air  Pain Assessment  Pain Scale 0-10  Pain Score 5  Pain Type Acute pain  Pain Location Head  Pain Orientation Posterior  Pain Descriptors / Indicators Headache  Neurological  Neuro (WDL) X  Orientation Level Oriented to person;Oriented to time  Cognition Poor safety awareness  Speech Clear  R Pupil Size (mm) 3  R Pupil Shape Round  R Pupil Reaction Brisk  L Pupil Size (mm) 3  L Pupil Shape Round  L Pupil Reaction Brisk  R Hand Grip Moderate  L Hand Grip Moderate  RUE Motor Response Purposeful movement  LUE Motor Response Purposeful movement  RLE Motor Response Purposeful movement  LLE Motor Response Purposeful movement  Neuro Symptoms Drowsiness;Forgetful

## 2022-10-25 DIAGNOSIS — R079 Chest pain, unspecified: Secondary | ICD-10-CM | POA: Diagnosis not present

## 2022-10-25 LAB — GLUCOSE, CAPILLARY
Glucose-Capillary: 167 mg/dL — ABNORMAL HIGH (ref 70–99)
Glucose-Capillary: 222 mg/dL — ABNORMAL HIGH (ref 70–99)

## 2022-10-25 MED ORDER — ASPIRIN 81 MG PO TBEC: 81.0000 mg | DELAYED_RELEASE_TABLET | Freq: Every day | ORAL | 1 refills | Status: DC

## 2022-10-25 MED ORDER — EZETIMIBE 10 MG PO TABS: 10.0000 mg | ORAL_TABLET | Freq: Every day | ORAL | 1 refills | Status: DC

## 2022-10-25 MED ORDER — MECLIZINE HCL 25 MG PO TABS: 25.0000 mg | ORAL_TABLET | Freq: Two times a day (BID) | ORAL | Status: DC | PRN

## 2022-10-25 MED ORDER — ICOSAPENT ETHYL 1 G PO CAPS: 2.0000 g | ORAL_CAPSULE | Freq: Two times a day (BID) | ORAL | 1 refills | Status: DC

## 2022-10-25 NOTE — Care Management (Cosign Needed)
    Durable Medical Equipment  (From admission, onward)           Start     Ordered   10/25/22 1205  For home use only DME Walker rolling  Once       Question Answer Comment  Walker: With 5 Inch Wheels   Patient needs a walker to treat with the following condition Weakness      10/25/22 1205

## 2022-10-25 NOTE — Discharge Summary (Signed)
Physician Discharge Summary  Scott Pineda FAO:[id number] DOB: March 04, 1960 DOA: 10/21/2022  PCP: [facility]  Admit date: 10/21/2022 Discharge date: 10/25/2022  Admitted From: Home Disposition: Home with home health  Recommendations for Outpatient Follow-up:  Follow up with PCP in 1 week Follow up with cardiology, Dr. Rennis Golden on 10/31  Discharge Condition: Stable CODE STATUS: Full code Diet recommendation: Heart healthy/carb modified diet  Brief/Interim Summary: Scott Pineda is a 62 y.o. male with past medical history significant for  insulin-dependent T2DM, HTN, HLD, asthma, remote history of sarcoidosis in remission, depression/anxiety who presented to the ED for evaluation of chest pain.  Patient states that he has been having chest pain on and off for the last 2 months.  Pain is described as sharp and sometimes pressure-like sensation.  It occurs in the midsternal to left chest area with radiation down his left arm and up his neck.  It may occur with rest.  Sometimes it is associated with dyspnea and diaphoresis. Patient states that this morning chest pain returned and was very severe.  He also reports cough with deep inspiration.  He denies any known personal cardiac history.  He is adopted and does not know his biological family history.  He is a never smoker but states that he was exposed to smoke in the house growing up.   He underwent nuclear stress testing recently 10/07/2022 in [place] which did not show reversible ischemia or infarction.  Normal LV wall motion, EF 60%.   Patient reports severe intolerance to statin which caused significant myopathy about 12 years ago.  He is not currently on aspirin.  Blood sugars have been poorly controlled recently.  He states that he stopped taking metformin due to GI intolerance and is now on insulin glargine 25 units every evening in addition to glipizide and [place].   In the ED, BP 159/104, pulse 110, RR 20, temp 97.8 F, SpO2 97% on  room air. Sodium 130 (139 when corrected for hyperglycemia), potassium 3.9, bicarb 20, BUN 9, creatinine 0.81, serum glucose 479, WBC 7.7, hemoglobin 16.1, platelets 221,000, troponin 7 > 8, beta hydroxybutyrate 1.40. CTA chest negative for PE or acute abnormality.  Aortic atherosclerosis noted. Patient was given 10 units subcutaneous NovoLog, 1 L normal saline, IV morphine 4 mg x 2.  EDP spoke with on-call [facility] cardiology Dr. Odis Hollingshead who recommended starting on IV heparin, medical admission, and potential cardiac cath in the morning.  Patient was started on IV nitroglycerin infusion.  The hospitalist service was consulted to admit for further evaluation and management.   Patient underwent heart cath 9/4, which revealed mild nonobstructive coronary artery disease.  On 9/5, patient suffered witnessed fall in his room after getting out of bed.  He hit his head, had some confusion and left-sided weakness.  Code stroke was activated.  CT head and MRI brain were unremarkable.   Patient continued to have clinical improvement.  Work with physical therapy.  Orthostatic vital sign was negative.  Discharge Diagnoses:   Principal Problem:   Chest pain Active Problems:   Type 2 diabetes mellitus with hyperglycemia, with long-term current use of insulin (HCC)   Hypertension associated with diabetes (HCC)   Hyperlipidemia associated with type 2 diabetes mellitus (HCC)   Depression   Asthma   Statin intolerance   Unstable angina (HCC)   Pure hypertriglyceridemia   Unstable angina Patient presenting with continued chest discomfort, ongoing for 2 months.  Localized to his substernal region with radiation to his  left arm/jaw.  He has had recent normal stress test at OSH.  Troponin is negative x 2 and EKG without acute ischemic changes.   -- Status post heart cath 9/4: Mild nonobstructive coronary artery disease --Echocardiogram with EF 60 to 65%, no regional wall motion abnormality, normal LV diastolic  parameter -- Aspirin   Acute metabolic encephalopathy after fall and trauma to his head -Stat CT head negative for acute abnormality -MRI brain negative -EEG negative -Appreciate neurology -Orthostatic vital sign negative -PT evaluation -Resolved   Type 2 diabetes mellitus with hyperglycemia, with long-term current use of insulin  Hemoglobin A1c 13.3, poorly controlled.  Recently stopped metformin due to GI side effect.  Home regimen includes Semglee 25 units subcutaneously nightly, glipizide 5 mg p.o. daily and Farxiga. -- Diabetic educator consult -- Semglee, NovoLog, sliding scale insulin.  r   Hypertension associated with diabetes  -- Lisinopril   Hyperlipidemia associated with type 2 diabetes mellitus  -- Vascepa    Asthma -- Albuterol neb every 6 hours as needed wheezing/shortness of breath   Depression -- Zoloft  Discharge Instructions  Discharge Instructions     Call MD for:  difficulty breathing, headache or visual disturbances   Complete by: As directed    Call MD for:  extreme fatigue   Complete by: As directed    Call MD for:  persistant dizziness or light-headedness   Complete by: As directed    Call MD for:  persistant nausea and vomiting   Complete by: As directed    Call MD for:  severe uncontrolled pain   Complete by: As directed    Call MD for:  temperature >100.4   Complete by: As directed    Diet - low sodium heart healthy   Complete by: As directed    Diet Carb Modified   Complete by: As directed    Discharge instructions   Complete by: As directed    You were cared for by a hospitalist during your hospital stay. If you have any questions about your discharge medications or the care you received while you were in the hospital after you are discharged, you can call the unit and ask to speak with the hospitalist on call if the hospitalist that took care of you is not available. Once you are discharged, your primary care physician will handle any  further medical issues. Please note that NO REFILLS for any discharge medications will be authorized once you are discharged, as it is imperative that you return to your primary care physician (or establish a relationship with a primary care physician if you do not have one) for your aftercare needs so that they can reassess your need for medications and monitor your lab values.   Increase activity slowly   Complete by: As directed       Allergies as of 10/25/2022       Reactions   Glucophage Xr [metformin] Other (See Comments)   Abdominal/stomach pain   Motrin [ibuprofen] Other (See Comments)   Unknown reaction   Statins Other (See Comments)   Myalgias  Weakness of significance         Medication List     TAKE these medications    aspirin EC 81 MG tablet Take 1 tablet (81 mg total) by mouth daily. Swallow whole. Start taking on: October 26, 2022   dapagliflozin propanediol 5 MG Tabs tablet Commonly known as: FARXIGA Take 5 mg by mouth daily.   diphenhydramine-acetaminophen 25-500 MG Tabs tablet  Commonly known as: TYLENOL PM Take 3 tablets by mouth at bedtime.   ezetimibe 10 MG tablet Commonly known as: ZETIA Take 1 tablet (10 mg total) by mouth daily. Start taking on: October 26, 2022   glipiZIDE 5 MG 24 hr tablet Commonly known as: GLUCOTROL XL Take 5 mg by mouth daily after supper.   icosapent Ethyl 1 g capsule Commonly known as: VASCEPA Take 2 capsules (2 g total) by mouth 2 (two) times daily.   lisinopril 20 MG tablet Commonly known as: ZESTRIL TAKE 1 TABLET (20 MG TOTAL) BY MOUTH DAILY.   Semglee (yfgn) 100 UNIT/ML Pen Generic drug: insulin glargine Inject 25 Units into the skin every evening.   sertraline 100 MG tablet Commonly known as: ZOLOFT TAKE 1 TABLET (100 MG TOTAL) BY MOUTH DAILY. What changed:  how much to take how to take this when to take this additional instructions        Follow-up Information     [facility]  Follow up.   Contact information: [address] [place] [address] [phone]                Allergies  Allergen Reactions   Glucophage Xr [Metformin] Other (See Comments)    Abdominal/stomach pain   Motrin [Ibuprofen] Other (See Comments)    Unknown reaction   Statins Other (See Comments)    Myalgias  Weakness of significance     Consultations: Cardiology Neurology   Procedures/Studies: EEG adult  Result Date: 11/11/2022 Charlsie Quest, MD     2022/11/11  8:37 AM Patient Name: SUZANNE MARSO MRN: [id number] Epilepsy Attending: Charlsie Quest Referring Physician/Provider: Milon Dikes, MD Date: 10/22/2052 Duration: 27.01 mins Patient history: 62yo M with ams getting eeg to evaluate for seizure Level of alertness: Awake AEDs during EEG study: None Technical aspects: This EEG study was done with scalp electrodes positioned according to the 10-20 International system of electrode placement. Electrical activity was reviewed with band pass filter of 1-70Hz , sensitivity of 7 uV/mm, display speed of 7mm/sec with a 60Hz  notched filter applied as appropriate. EEG data were recorded continuously and digitally stored.  Video monitoring was available and reviewed as appropriate. Description: The posterior dominant rhythm consists of 8-9 Hz activity of moderate voltage (25-35 uV) seen predominantly in posterior head regions, symmetric and reactive to eye opening and eye closing. Drowsiness was characterized by attenuation of the posterior background rhythm. Physiologic photic driving was seen during photic stimulation.  Hyperventilation was not performed.   IMPRESSION: This study is within normal limits. No seizures or epileptiform discharges were seen throughout the recording. A normal interictal EEG does not exclude the diagnosis of epilepsy. Charlsie Quest   MR BRAIN WO CONTRAST  Result Date: 10/23/2022 CLINICAL DATA:  Neuro deficit, acute, stroke suspected EXAM: MRI HEAD  WITHOUT CONTRAST TECHNIQUE: Multiplanar, multiecho pulse sequences of the brain and surrounding structures were obtained without intravenous contrast. COMPARISON:  CT head 10/23/2022. FINDINGS: Brain: No acute infarction, hemorrhage, hydrocephalus, extra-axial collection or mass lesion. Vascular: Major arterial flow voids are maintained at the skull base. Skull and upper cervical spine: Major arterial flow voids are maintained at the skull base. Sinuses/Orbits: Negative. Other: No mastoid effusions. IMPRESSION: No evidence of acute intracranial abnormality. Electronically Signed   By: Feliberto Harts M.D.   On: 10/23/2022 15:28   CT HEAD CODE STROKE WO CONTRAST`  Result Date: 10/23/2022 CLINICAL DATA:  Code stroke. Head trauma, coagulopathy (Age 25-64y) fall. Left-sided weakness and  drowsiness. EXAM: CT HEAD WITHOUT CONTRAST TECHNIQUE: Contiguous axial images were obtained from the base of the skull through the vertex without intravenous contrast. RADIATION DOSE REDUCTION: This exam was performed according to the departmental dose-optimization program which includes automated exposure control, adjustment of the mA and/or kV according to patient size and/or use of iterative reconstruction technique. COMPARISON:  CT head April 25, 2005 FINDINGS: Brain: No evidence of acute infarction, hemorrhage, hydrocephalus, extra-axial collection or mass lesion/mass effect. Vascular: No hyperdense vessel identified. Skull: No acute fracture. Sinuses/Orbits: Mostly clear sinuses.  No acute orbital findings. Other: No mastoid effusions. ASPECTS [facility] Stroke Program Early CT Score) Total score (0-10 with 10 being normal): 10. IMPRESSION: 1. No evidence of acute intracranial abnormality. 2. ASPECTS is 10. Code stroke imaging results were communicated on 10/23/2022 at 1:13 pm to provider Dr. Wilford Corner via secure text paging. Electronically Signed   By: Feliberto Harts M.D.   On: 10/23/2022 13:14   ECHOCARDIOGRAM COMPLETE  Result  Date: 10/22/2022    ECHOCARDIOGRAM REPORT   Patient Name:   SOLLY DAMME Date of Exam: 10/22/2022 Medical Rec #:  [id number]       Height:       72.0 in Accession #:    [id number]      Weight:       190.5 lb Date of Birth:  Apr 23, 1960      BSA:          2.087 m Patient Age:    61 years        BP:           135/84 mmHg Patient Gender: M               HR:           85 bpm. Exam Location:  Inpatient Procedure: 2D Echo, Cardiac Doppler and Color Doppler Indications:    Chest Pain R07.9  History:        Patient has no prior history of Echocardiogram examinations.                 Angina, Signs/Symptoms:Chest Pain; Risk Factors:Hypertension,                 Diabetes and Dyslipidemia.  Sonographer:    Lucendia Herrlich Referring Phys: [id number] VISHAL R PATEL IMPRESSIONS  1. Left ventricular ejection fraction, by estimation, is 60 to 65%. The left ventricle has normal function. The left ventricle has no regional wall motion abnormalities. Left ventricular diastolic parameters were normal.  2. Right ventricular systolic function is normal. The right ventricular size is normal. There is normal pulmonary artery systolic pressure.  3. The mitral valve is normal in structure. No evidence of mitral valve regurgitation. No evidence of mitral stenosis.  4. The aortic valve is tricuspid. Aortic valve regurgitation is trivial. No aortic stenosis is present.  5. The inferior vena cava is normal in size with greater than 50% respiratory variability, suggesting right atrial pressure of 3 mmHg.  6. Rhythm strip during this exam demonstrates normal sinus rhythm. Comparison(s): No prior Echocardiogram. FINDINGS  Left Ventricle: Left ventricular ejection fraction, by estimation, is 60 to 65%. The left ventricle has normal function. The left ventricle has no regional wall motion abnormalities. The left ventricular internal cavity size was normal in size. There is  no left ventricular hypertrophy. Left ventricular diastolic parameters were  normal. Normal left ventricular filling pressure. Right Ventricle: The right ventricular size is normal. No increase in right ventricular wall thickness.  Right ventricular systolic function is normal. There is normal pulmonary artery systolic pressure. The tricuspid regurgitant velocity is 1.12 m/s, and  with an assumed right atrial pressure of 3 mmHg, the estimated right ventricular systolic pressure is 8.0 mmHg. Left Atrium: Left atrial size was normal in size. Right Atrium: Right atrial size was normal in size. Pericardium: There is no evidence of pericardial effusion. Mitral Valve: The mitral valve is normal in structure. No evidence of mitral valve regurgitation. No evidence of mitral valve stenosis. Tricuspid Valve: The tricuspid valve is grossly normal. Tricuspid valve regurgitation is trivial. No evidence of tricuspid stenosis. Aortic Valve: The aortic valve is tricuspid. Aortic valve regurgitation is trivial. No aortic stenosis is present. Aortic valve peak gradient measures 6.4 mmHg. Pulmonic Valve: The pulmonic valve was normal in structure. Pulmonic valve regurgitation is not visualized. No evidence of pulmonic stenosis. Aorta: The aortic root and ascending aorta are structurally normal, with no evidence of dilitation. Venous: The inferior vena cava is normal in size with greater than 50% respiratory variability, suggesting right atrial pressure of 3 mmHg. IAS/Shunts: The interatrial septum was not well visualized. EKG: Rhythm strip during this exam demonstrates normal sinus rhythm.  LEFT VENTRICLE PLAX 2D LVIDd:         4.50 cm   Diastology LVIDs:         3.00 cm   LV e' medial:    9.95 cm/s LV PW:         1.00 cm   LV E/e' medial:  7.4 LV IVS:        1.00 cm   LV e' lateral:   15.50 cm/s LVOT diam:     2.10 cm   LV E/e' lateral: 4.8 LV SV:         61 LV SV Index:   29 LVOT Area:     3.46 cm  RIGHT VENTRICLE             IVC RV S prime:     21.80 cm/s  IVC diam: 1.85 cm TAPSE (M-mode): 2.0 cm LEFT  ATRIUM           Index        RIGHT ATRIUM           Index LA diam:      4.00 cm 1.92 cm/m   RA Area:     11.60 cm LA Vol (A2C): 29.4 ml 14.06 ml/m  RA Volume:   23.80 ml  11.40 ml/m LA Vol (A4C): 33.6 ml 16.10 ml/m  AORTIC VALVE                 PULMONIC VALVE AV Area (Vmax): 2.91 cm     PR End Diast Vel: 5.76 msec AV Vmax:        126.00 cm/s AV Peak Grad:   6.4 mmHg LVOT Vmax:      106.00 cm/s LVOT Vmean:     69.750 cm/s LVOT VTI:       0.176 m  AORTA Ao Root diam: 3.60 cm Ao Asc diam:  3.40 cm MITRAL VALVE               TRICUSPID VALVE MV Area (PHT): 5.13 cm    TR Peak grad:   5.0 mmHg MV Decel Time: 148 msec    TR Vmax:        112.00 cm/s MV E velocity: 74.10 cm/s MV A velocity: 70.30 cm/s  SHUNTS MV E/A ratio:  1.05  Systemic VTI:  0.18 m                            Systemic Diam: 2.10 cm Sunit Tolia DO Electronically signed by Tessa Lerner DO Signature Date/Time: 10/22/2022/4:30:28 PM    Final    CARDIAC CATHETERIZATION  Result Date: 10/22/2022 Images from the original result were not included. Coronary angiography 10/22/2022: LM: Normal LAD: Prox diffuse 3040% disease Lcx: No significant disease RCA: Minimal luminal irregularities Coronary physiology testing: RFR: 0.94 FFR: 0.88 CFR: 3.4 IMR: 25 Numbers do not suggest coronary microvascular dysfunction. Conclusion: Mild nonobstructive coronary artery disease No evidence of coronary microvascular dysfunction Elder Negus, MD Pager: [phone] Office: [phone]  CT Angio Chest PE W/Cm &/Or Wo Cm  Result Date: 10/21/2022 CLINICAL DATA:  Shortness of breath.  PE suspected EXAM: CT ANGIOGRAPHY CHEST WITH CONTRAST TECHNIQUE: Multidetector CT imaging of the chest was performed using the standard protocol during bolus administration of intravenous contrast. Multiplanar CT image reconstructions and MIPs were obtained to evaluate the vascular anatomy. RADIATION DOSE REDUCTION: This exam was performed according to the departmental  dose-optimization program which includes automated exposure control, adjustment of the mA and/or kV according to patient size and/or use of iterative reconstruction technique. CONTRAST:  75mL OMNIPAQUE IOHEXOL 350 MG/ML SOLN COMPARISON:  Chest radiograph 10/21/2022 and CT chest report from 08/24/2020 FINDINGS: Cardiovascular: Negative for acute pulmonary embolism. Normal caliber thoracic aorta. No pericardial effusion. Aortic and coronary artery atherosclerotic calcification. Mediastinum/Nodes: Trachea and esophagus are unremarkable. No thoracic adenopathy Lungs/Pleura: No focal consolidation, pleural effusion, or pneumothorax. Upper Abdomen: No acute abnormality. Musculoskeletal: No acute fracture. Review of the MIP images confirms the above findings. IMPRESSION: Negative for acute pulmonary embolism. No acute abnormality in the chest. Aortic Atherosclerosis (ICD10-I70.0). Electronically Signed   By: Minerva Fester M.D.   On: 10/21/2022 18:14   DG Chest 2 View  Result Date: 10/21/2022 CLINICAL DATA:  Major chest pain that radiates into the left arm and neck EXAM: CHEST - 2 VIEW COMPARISON:  X-ray 09/03/2022 FINDINGS: The heart size and mediastinal contours are within normal limits. No consolidation, pneumothorax or effusion. No edema. The visualized skeletal structures are unremarkable. Mild pectus excavatum on lateral view. Overlapping cardiac leads. IMPRESSION: No acute cardiopulmonary disease. Electronically Signed   By: Karen Kays M.D.   On: 10/21/2022 17:58      Discharge Exam: Vitals:   10/25/22 0529 10/25/22 0752  BP:  125/73  Pulse:  79  Resp: 20 19  Temp:  97.8 F (36.6 C)  SpO2:  92%    General: Pt is alert, awake, not in acute distress Cardiovascular: RRR, S1/S2 +, no edema Respiratory: CTA bilaterally, no wheezing, no rhonchi, no respiratory distress, no conversational dyspnea  Abdominal: Soft, NT, ND, bowel sounds + Extremities: no edema, no cyanosis Psych: Normal mood and  affect, stable judgement and insight     The results of significant diagnostics from this hospitalization (including imaging, microbiology, ancillary and laboratory) are listed below for reference.     Microbiology: No results found for this or any previous visit (from the past 240 hour(s)).   Labs: BNP (last 3 results) No results for input(s): "BNP" in the last 8760 hours. Basic Metabolic Panel: Recent Labs  Lab 10/21/22 1349 10/22/22 0330 10/23/22 0419 10/23/22 1431  NA 130* 135 135 138  K 3.9 3.1* 3.9 3.9  CL 95* 100 100 98  CO2 20* 21* 25 23  GLUCOSE  479* 274* 243* 257*  BUN 9 7* 12 15  CREATININE 0.81 0.78 1.02 1.27*  CALCIUM 9.3 8.1* 9.0 9.4  MG  --  1.9 2.0  --    Liver Function Tests: Recent Labs  Lab 10/23/22 1431  AST 18  ALT 17  ALKPHOS 48  BILITOT 0.9  PROT 6.6  ALBUMIN 3.7   No results for input(s): "LIPASE", "AMYLASE" in the last 168 hours. Recent Labs  Lab 10/23/22 1431  AMMONIA 33   CBC: Recent Labs  Lab 10/21/22 1349 10/22/22 0330 10/23/22 0419  WBC 7.7 7.4 6.8  HGB 16.1 13.5 14.5  HCT 45.5 39.7 42.8  MCV 88.0 87.4 88.2  PLT 221 183 194   Cardiac Enzymes: No results for input(s): "CKTOTAL", "CKMB", "CKMBINDEX", "TROPONINI" in the last 168 hours. BNP: Invalid input(s): "POCBNP" CBG: Recent Labs  Lab 10/24/22 0828 10/24/22 1303 10/24/22 1330 10/24/22 1550 10/24/22 2158  GLUCAP 290* 320* 381* 179* 283*   D-Dimer No results for input(s): "DDIMER" in the last 72 hours. Hgb A1c No results for input(s): "HGBA1C" in the last 72 hours. Lipid Profile No results for input(s): "CHOL", "HDL", "LDLCALC", "TRIG", "CHOLHDL", "LDLDIRECT" in the last 72 hours. Thyroid function studies No results for input(s): "TSH", "T4TOTAL", "T3FREE", "THYROIDAB" in the last 72 hours.  Invalid input(s): "FREET3" Anemia work up No results for input(s): "VITAMINB12", "FOLATE", "FERRITIN", "TIBC", "IRON", "RETICCTPCT" in the last 72  hours. Urinalysis    Component Value Date/Time   COLORURINE STRAW (A) 10/23/2022 1559   APPEARANCEUR CLEAR 10/23/2022 1559   LABSPEC 1.028 10/23/2022 1559   PHURINE 6.0 10/23/2022 1559   GLUCOSEU >=500 (A) 10/23/2022 1559   HGBUR NEGATIVE 10/23/2022 1559   HGBUR negative 08/14/2008 1519   BILIRUBINUR NEGATIVE 10/23/2022 1559   KETONESUR 5 (A) 10/23/2022 1559   PROTEINUR NEGATIVE 10/23/2022 1559   UROBILINOGEN 0.2 08/14/2008 1519   NITRITE NEGATIVE 10/23/2022 1559   LEUKOCYTESUR NEGATIVE 10/23/2022 1559   Sepsis Labs Recent Labs  Lab 10/21/22 1349 10/22/22 0330 10/23/22 0419  WBC 7.7 7.4 6.8   Microbiology No results found for this or any previous visit (from the past 240 hour(s)).   Patient was seen and examined on the day of discharge and was found to be in stable condition. Time coordinating discharge: 40 minutes including assessment and coordination of care, as well as examination of the patient.   SIGNED:  Noralee Stain, DO [facility] 10/25/2022, 10:59 AM

## 2022-10-25 NOTE — TOC Initial Note (Addendum)
Transition of Care [facility]) - Initial/Assessment Note    Patient Details  Name: Scott Pineda MRN: [id number] Date of Birth: 07/18/1960  Transition of Care [facility]) CM/SW Contact:    Ronny Bacon, RN Phone Number: 10/25/2022, 1:59 PM  Clinical Narrative:     Patient was being discharged today. RW ordered through [employer]. [facility]. Attempt made with Tresa Endo- [facility], awaiting response and Morrie Sheldon- [facility], awaiting response.           Expected Discharge Plan: Home w Home Health Services Barriers to Discharge: Other (must enter comment) (pt with dizziness and nausea when working with PT)   Patient Goals and CMS Choice Patient states their goals for this hospitalization and ongoing recovery are:: to go home when stable          Expected Discharge Plan and Services         Expected Discharge Date: 10/25/22               DME Arranged: Dan Humphreys rolling DME Agency: [employer] Date DME Agency Contacted: 10/25/22 Time DME Agency Contacted: 1220 Representative spoke with at DME Agency: Vaughan Basta HH Arranged:  ([facility]   Date [facility] Agency Contacted: 10/25/22 Time HH Agency Contacted: 1203 Representative spoke with at [facility] Agency: Eber Assefa  Prior Living Arrangements/Services     Patient language and need for interpreter reviewed:: Yes Do you feel safe going back to the place where you live?: Yes      Need for Family Participation in Patient Care: Yes (Comment) Care giver support system in place?: Yes (comment)   Criminal Activity/Legal Involvement Pertinent to Current Situation/Hospitalization: No - Comment as needed  Activities of Daily Living      Permission Sought/Granted Permission sought to share information with : Facility Medical sales representative, Case Estate manager/land agent granted to share  information with : Yes, Verbal Permission Granted              Emotional Assessment   Attitude/Demeanor/Rapport: Engaged Affect (typically observed): Appropriate   Alcohol / Substance Use: Not Applicable Psych Involvement: No (comment)  Admission diagnosis:  Unstable angina (HCC) [I20.0] Hyperglycemia [R73.9] Chest pain [R07.9] Chest pain, unspecified type [R07.9] Patient Active Problem List   Diagnosis Date Noted   Unstable angina (HCC) 10/22/2022   Pure hypertriglyceridemia 10/22/2022   Chest pain 10/21/2022   Type 2 diabetes mellitus with hyperglycemia, with long-term current use of insulin (HCC) 10/21/2022   Statin intolerance 05/27/2015   Uncontrolled type 2 diabetes mellitus without complication, without long-term current use of insulin 04/20/2015   Medication management 05/23/2013   Foot pain, right 05/23/2013   Type 2 diabetes mellitus, uncontrolled 05/17/2012   Diabetes (HCC) 05/17/2012   GANGLION CYST, WRIST, RIGHT 04/02/2009   DIABETES-TYPE 2 10/09/2008   SARCOIDOSIS 08/12/2006   Hyperlipidemia associated with type 2 diabetes mellitus (HCC) 08/12/2006   GOUT 08/12/2006   Depression 08/12/2006   Hypertension associated with diabetes (HCC) 08/12/2006   ALLERGIC RHINITIS 08/12/2006   Asthma 08/12/2006   GERD 08/12/2006   PCP:  [facility]:   [id number] - [place] ([place]), [place] - [address] [place] ([place]) [place] [address] Phone: [phone] Fax: [phone]  [id number] - [place], [place] - [address] [place] [address] Phone: [phone] Fax: [phone]     Social Determinants of Health (  SDOH) Social History: SDOH Screenings   Tobacco Use: Low Risk  (10/21/2022)   SDOH Interventions:     Readmission Risk Interventions     No data to display

## 2022-10-25 NOTE — Progress Notes (Signed)
Physical Therapy Treatment Patient Details Name: Scott Pineda MRN: [id number] DOB: 25-Jan-1961 Today's Date: 10/25/2022   History of Present Illness 62 yo male presents to [facility] on 9/3 with L-sided chest pain radiating down L neck and arm, SOB. S/p LHC on 9/4 showing mild nonobstructive CAD, no evidence of coronary microvascular dysfunction. Fall OOB on 9/5 due to LLE weakness, code stroke with MRI and head CT negative for acute abnormality. Nuclear stress test on 8/20 OP which ddi not show reversible ischemia or infarction, did show normal LV wall motion and EF of 60%. PMH includes HTN, HLD, DM, GERD, sarcoidosis in remission.    PT Comments  Pt admitted with above diagnosis. Pt positive for right BPPV and treated with Epley maneuver.  Pt nauseated and with incr headache after treatment and decided to come back and check on pt in a bit to see if he needed more treatment today or in am.   Pt currently with functional limitations due to the deficits listed below (see PT Problem List). Pt will benefit from acute skilled PT to increase their independence and safety with mobility to allow discharge.       If plan is discharge home, recommend the following: A little help with walking and/or transfers;A little help with bathing/dressing/bathroom;Assistance with cooking/housework;Assist for transportation;Help with stairs or ramp for entrance   Can travel by private vehicle        Equipment Recommendations  Rolling walker (2 wheels)    Recommendations for Other Services       Precautions / Restrictions Precautions Precautions: Fall Restrictions Weight Bearing Restrictions: No     Mobility  Bed Mobility Overal bed mobility: Needs Assistance Bed Mobility: Supine to Sit, Sit to Supine     Supine to sit: Supervision Sit to supine: Supervision   General bed mobility comments: use of bedrails and HOB elevation    Transfers Overall transfer level: Needs assistance Equipment used: Rolling  walker (2 wheels) Transfers: Sit to/from Stand Sit to Stand: Min assist           General transfer comment: assist to rise and steady    Ambulation/Gait Ambulation/Gait assistance: Min assist Gait Distance (Feet): 30 Feet Assistive device: Rolling walker (2 wheels) Gait Pattern/deviations: Step-through pattern, Decreased stride length, Trunk flexed Gait velocity: decr     General Gait Details: assist to steady,  VSS. cues for widening BOS. Pt very slow.  Needed guard assist for safety as pt c/o dizziness at times.   Stairs             Wheelchair Mobility     Tilt Bed    Modified Rankin (Stroke Patients Only)       Balance Overall balance assessment: History of Falls, Needs assistance Sitting-balance support: No upper extremity supported, Feet supported Sitting balance-Leahy Scale: Fair     Standing balance support: During functional activity, Bilateral upper extremity supported, Reliant on assistive device for balance Standing balance-Leahy Scale: Poor Standing balance comment: relies on UE support for balance                            Cognition Arousal: Alert Behavior During Therapy: Anxious Overall Cognitive Status: Within Functional Limits for tasks assessed                                 General Comments: history of anxiety  Exercises Other Exercises Other Exercises: pt describes lightheadedness, light sensitivity, headache, difficulty concentrating consistent with concussion s/p fall. PT educated pt on concussion protocol and administered handout, pt and family express understanding Other Exercises: Gave [employer] handout [id number] and fully educated on right Epley, Austin Miles, and explanation of BPPV as well as rolling habituation    General Comments General comments (skin integrity, edema, etc.): VSS, Tested for BPPV and positive for right BPPV posterior canal and treated with Epley maneuver. Pt felt worse  after treatment.      Pertinent Vitals/Pain Pain Assessment Pain Assessment: Faces Faces Pain Scale: Hurts even more Pain Location: head Pain Descriptors / Indicators: Headache Pain Intervention(s): Limited activity within patient's tolerance, Monitored during session, Repositioned    Home Living                          Prior Function            PT Goals (current goals can now be found in the care plan section) Progress towards PT goals: Progressing toward goals    Frequency    Min 1X/week      PT Plan      Co-evaluation              AM-PAC PT "6 Clicks" Mobility   Outcome Measure  Help needed turning from your back to your side while in a flat bed without using bedrails?: A Little Help needed moving from lying on your back to sitting on the side of a flat bed without using bedrails?: A Little Help needed moving to and from a bed to a chair (including a wheelchair)?: A Little Help needed standing up from a chair using your arms (e.g., wheelchair or bedside chair)?: A Little Help needed to walk in hospital room?: A Little Help needed climbing 3-5 steps with a railing? : A Lot 6 Click Score: 17    End of Session Equipment Utilized During Treatment: Gait belt Activity Tolerance: Other (comment) (pt nauseated, dry heaving and headache after BPPV treatment) Patient left: in bed;with call bell/phone within reach;with bed alarm set;with family/visitor present Nurse Communication: Mobility status PT Visit Diagnosis: Other abnormalities of gait and mobility (R26.89);Muscle weakness (generalized) (M62.81)     Time: 1610-9604 PT Time Calculation (min) (ACUTE ONLY): 48 min  Charges:    $Gait Training: 8-22 mins $Therapeutic Exercise: 8-22 mins $Canalith Rep Proc: 8-22 mins PT General Charges $$ ACUTE PT VISIT: 1 Visit                     Diani Jillson M,PT Acute Rehab Services [phone]    Bevelyn Buckles 10/25/2022, 5:06 PM

## 2022-10-25 NOTE — Progress Notes (Signed)
10/25/22 1700  PT Visit Information  Last PT Received On 10/25/22  Assistance Needed +1  History of Present Illness 62 yo male presents to [facility] on 9/3 with L-sided chest pain radiating down L neck and arm, SOB. S/p LHC on 9/4 showing mild nonobstructive CAD, no evidence of coronary microvascular dysfunction. Fall OOB on 9/5 due to LLE weakness, code stroke with MRI and head CT negative for acute abnormality. Nuclear stress test on 8/20 OP which ddi not show reversible ischemia or infarction, did show normal LV wall motion and EF of 60%. PMH includes HTN, HLD, DM, GERD, sarcoidosis in remission.  Precautions  Precautions Fall  Restrictions  Weight Bearing Restrictions No  Pain Assessment  Pain Assessment Faces  Faces Pain Scale 4  Pain Location head  Pain Descriptors / Indicators Headache  Pain Intervention(s) Limited activity within patient's tolerance;Monitored during session;Repositioned  Cognition  Arousal Alert  Behavior During Therapy Anxious  Overall Cognitive Status Within Functional Limits for tasks assessed  General Comments history of anxiety  Bed Mobility  Overal bed mobility Needs Assistance  Bed Mobility Supine to Sit;Sit to Supine  Supine to sit Supervision  Sit to supine Supervision  General bed mobility comments use of bedrails and HOB elevation  Exercises  Exercises Other exercises  Other Exercises  Other Exercises reviewed Austin Miles and educated pt to perform several x today  PT - End of Session  Activity Tolerance Patient limited by fatigue  Patient left in bed;with call bell/phone within reach;with bed alarm set;with family/visitor present  Nurse Communication Mobility status   PT - Assessment/Plan  PT Visit Diagnosis Other abnormalities of gait and mobility (R26.89);Muscle weakness (generalized) (M62.81)  PT Frequency (ACUTE ONLY) Min 1X/week  Follow Up Recommendations Outpatient PT (vestibular rehab)  Patient can return home with the following A  little help with walking and/or transfers;A little help with bathing/dressing/bathroom;Assistance with cooking/housework;Assist for transportation;Help with stairs or ramp for entrance  PT equipment Rolling walker (2 wheels)  AM-PAC PT "6 Clicks" Mobility Outcome Measure (Version 2)  Help needed turning from your back to your side while in a flat bed without using bedrails? 3  Help needed moving from lying on your back to sitting on the side of a flat bed without using bedrails? 3  Help needed moving to and from a bed to a chair (including a wheelchair)? 3  Help needed standing up from a chair using your arms (e.g., wheelchair or bedside chair)? 3  Help needed to walk in hospital room? 3  Help needed climbing 3-5 steps with a railing?  2  6 Click Score 17  Consider Recommendation of Discharge To: Home with [facility]  Progressive Mobility  What is the highest level of mobility based on the progressive mobility assessment? Level 4 (Walks with assist in room) - Balance while marching in place and cannot step forward and back - Complete  Activity Ambulated with assistance in room  PT Goal Progression  Progress towards PT goals Progressing toward goals  PT Time Calculation  PT Start Time (ACUTE ONLY) 1505  PT Stop Time (ACUTE ONLY) 1515  PT Time Calculation (min) (ACUTE ONLY) 10 min  PT General Charges  $$ ACUTE PT VISIT 1 Visit  PT Treatments  $Therapeutic Exercise 8-22 mins   Reviewed Austin Miles exercise with pt to perform tonight as he didn't want to repeat Epley this pm.  Will return in am at 10 am and continue therapy.  Shreyas Piatkowski M,PT Acute Rehab Services [phone]

## 2022-10-26 LAB — GLUCOSE, CAPILLARY: Glucose-Capillary: 160 mg/dL — ABNORMAL HIGH (ref 70–99)

## 2022-10-26 MED ORDER — ICOSAPENT ETHYL 1 G PO CAPS: 2.0000 g | ORAL_CAPSULE | Freq: Two times a day (BID) | ORAL | 1 refills | Status: DC

## 2022-10-26 MED ORDER — EZETIMIBE 10 MG PO TABS: 10.0000 mg | ORAL_TABLET | Freq: Every day | ORAL | 1 refills | Status: DC

## 2022-10-26 MED ORDER — MECLIZINE HCL 25 MG PO TABS: 25.0000 mg | ORAL_TABLET | Freq: Two times a day (BID) | ORAL | 0 refills | Status: DC | PRN

## 2022-10-26 MED ORDER — ASPIRIN 81 MG PO TBEC: 81.0000 mg | DELAYED_RELEASE_TABLET | Freq: Every day | ORAL | 1 refills | Status: DC

## 2022-10-26 NOTE — TOC Transition Note (Signed)
Transition of Care [facility]) - CM/SW Discharge Note   Patient Details  Name: Scott Pineda MRN: [id number] Date of Birth: 1960-11-23  Transition of Care [facility]) CM/SW Contact:  Ronny Bacon, RN Phone Number: 10/26/2022, 11:59 AM   Clinical Narrative:   Patient discharged today. Outpatient referral # [id number] for [facility] due to being closest to patient home for vestibular PT.    Final next level of care: Home/Self Care Barriers to Discharge: Other (must enter comment) (pt with dizziness and nausea when working with PT)   Patient Goals and CMS Choice      Discharge Placement                         Discharge Plan and Services Additional resources added to the After Visit Summary for                  DME Arranged: Walker rolling DME Agency: [employer] Date DME Agency Contacted: 10/25/22 Time DME Agency Contacted: 1220 Representative spoke with at DME Agency: Vaughan Basta HH Arranged:  ([facility]   Date [facility] Agency Contacted: 10/25/22 Time HH Agency Contacted: 1203 Representative spoke with at [facility] Agency: Eber Boyington  Social Determinants of Health (SDOH) Interventions SDOH Screenings   Tobacco Use: Low Risk  (10/21/2022)     Readmission Risk Interventions     No data to display

## 2022-10-26 NOTE — Progress Notes (Signed)
  PROGRESS NOTE  Patient was supposed to discharge yesterday, but after working with PT and diagnosed with vertigo, patient felt poorly and was kept in the hospital another night.  This morning, patient is feeling a lot better.  He was reevaluated by PT again this morning.  Okay to discharge home with meclizine as needed.  Noralee Stain, DO [facility] 10/26/2022, 11:13 AM  Available via Epic secure chat 7am-7pm After these hours, please refer to coverage provider listed on [email or web address]

## 2022-10-26 NOTE — Progress Notes (Signed)
Physical Therapy Treatment Patient Details Name: Scott Pineda MRN: [id number] DOB: 02-08-1961 Today's Date: 10/26/2022   History of Present Illness 62 yo male presents to [facility] on 9/3 with L-sided chest pain radiating down L neck and arm, SOB. S/p LHC on 9/4 showing mild nonobstructive CAD, no evidence of coronary microvascular dysfunction. Fall OOB on 9/5 due to LLE weakness, code stroke with MRI and head CT negative for acute abnormality. Nuclear stress test on 8/20 OP which ddi not show reversible ischemia or infarction, did show normal LV wall motion and EF of 60%. PMH includes HTN, HLD, DM, GERD, sarcoidosis in remission.    PT Comments  Pt admitted with above diagnosis. Pt tested negative for BPPV all canals. Could not elicit nystagmus or dizziness today.  Pt much better.  Did discuss at length with pt that Austin Miles exercises could be discontinued as pt doesn't have BPPV at this time but to keep them handy if symptoms return.  Pt does appear to have a right hypofunction and would benefit from x 1 exercises and educated pt at length regarding these exercises. Pt wants to return to work later this week and he feels like he can now that his dizziness has improved. Encouraged pt to perform x1 exercises as able to strengthen his system so that his vertigo would improve.  Pt and wife feel ready to go home today and pt aware to f/u at Outpt PT and to use RW prn.   Pt currently with functional limitations due to the deficits listed below (see PT Problem List). Pt will benefit from acute skilled PT to increase their independence and safety with mobility to allow discharge.       If plan is discharge home, recommend the following: A little help with walking and/or transfers;A little help with bathing/dressing/bathroom;Assistance with cooking/housework;Assist for transportation;Help with stairs or ramp for entrance   Can travel by private vehicle        Equipment Recommendations  Rolling walker (2  wheels)    Recommendations for Other Services       Precautions / Restrictions Precautions Precautions: Fall Restrictions Weight Bearing Restrictions: No     Mobility  Bed Mobility Overal bed mobility: Needs Assistance Bed Mobility: Supine to Sit, Sit to Supine     Supine to sit: Independent Sit to supine: Independent   General bed mobility comments: No assist.    Transfers Overall transfer level: Needs assistance Equipment used: Rolling walker (2 wheels) Transfers: Sit to/from Stand Sit to Stand: Independent                Ambulation/Gait Ambulation/Gait assistance: Modified independent (Device/Increase time) Gait Distance (Feet): 45 Feet Assistive device: Rolling walker (2 wheels) Gait Pattern/deviations: Step-through pattern, Decreased stride length, Trunk flexed   Gait velocity interpretation: 1.31 - 2.62 ft/sec, indicative of limited community ambulator   General Gait Details: Pt steady with use of RW and no LOB with min challenges. Walking to bathroom in rom with Modif I with RW to and frmo bathroom.   Stairs             Wheelchair Mobility     Tilt Bed    Modified Rankin (Stroke Patients Only)       Balance Overall balance assessment: History of Falls, Needs assistance Sitting-balance support: No upper extremity supported, Feet supported Sitting balance-Leahy Scale: Fair     Standing balance support: During functional activity, Bilateral upper extremity supported, Reliant on assistive device for balance Standing balance-Leahy Scale:  Fair Standing balance comment: relies on UE support for balance dynamically but can stand statically without UE support                            Cognition Arousal: Alert Behavior During Therapy: Anxious Overall Cognitive Status: Within Functional Limits for tasks assessed                                 General Comments: history of anxiety        Exercises Other  Exercises Other Exercises: Continues with lightheadedness, light sensitivity, headache, difficulty concentrating consistent with concussion s/p fall. Other Exercises: reviewed Austin Miles with pt having no dizziness or symptoms with this therefore educated to only complete if the BPPV symptoms of dizziness for seconds returns. Other Exercises: x 1 exercise handout [id number] given to pt and pt educated in progression of x 1 exercises.  Pt performed sitting x1 exercises as well.    General Comments General comments (skin integrity, edema, etc.): VSS, Dizziness yesterday 7/10 on arrival. Pt states dizziness 2/10 today. Tested for BPPV and pt negative all canals and could not elicit nystagmus or dizziness. Pt does appear to have a right vestibular hypofunction per testing.      Pertinent Vitals/Pain Pain Assessment Pain Assessment: 0-10 Faces Pain Scale: Hurts little more Pain Location: head Pain Descriptors / Indicators: Headache Pain Intervention(s): Limited activity within patient's tolerance, Monitored during session, Repositioned, Premedicated before session    Home Living                          Prior Function            PT Goals (current goals can now be found in the care plan section) Progress towards PT goals: Progressing toward goals    Frequency    Min 1X/week      PT Plan      Co-evaluation              AM-PAC PT "6 Clicks" Mobility   Outcome Measure  Help needed turning from your back to your side while in a flat bed without using bedrails?: A Little Help needed moving from lying on your back to sitting on the side of a flat bed without using bedrails?: A Little Help needed moving to and from a bed to a chair (including a wheelchair)?: A Little Help needed standing up from a chair using your arms (e.g., wheelchair or bedside chair)?: A Little Help needed to walk in hospital room?: A Little Help needed climbing 3-5 steps with a railing? : A  Lot 6 Click Score: 17    End of Session Equipment Utilized During Treatment: Gait belt Activity Tolerance: Patient limited by fatigue Patient left: in bed;with call bell/phone within reach;with bed alarm set;with family/visitor present Nurse Communication: Mobility status PT Visit Diagnosis: Other abnormalities of gait and mobility (R26.89);Muscle weakness (generalized) (M62.81)     Time: 3086-5784 PT Time Calculation (min) (ACUTE ONLY): 66 min  Charges:    $Gait Training: 8-22 mins $Therapeutic Exercise: 23-37 mins $Physical Performance Test: 8-22 mins PT General Charges $$ ACUTE PT VISIT: 1 Visit                     Pairlee Sawtell M,PT Acute Rehab Services [phone]    Bevelyn Buckles 10/26/2022, 11:23 AM

## 2022-10-27 LAB — GLUCOSE, CAPILLARY: Glucose-Capillary: 199 mg/dL — ABNORMAL HIGH (ref 70–99)

## 2022-12-18 ENCOUNTER — Encounter: Payer: Self-pay | Admitting: Internal Medicine

## 2022-12-18 ENCOUNTER — Ambulatory Visit: Payer: 59 | Attending: Internal Medicine | Admitting: Internal Medicine

## 2022-12-18 VITALS — BP 148/90 | HR 99 | Ht 72.0 in | Wt 194.0 lb

## 2022-12-18 DIAGNOSIS — E785 Hyperlipidemia, unspecified: Secondary | ICD-10-CM | POA: Diagnosis not present

## 2022-12-18 DIAGNOSIS — M791 Myalgia, unspecified site: Secondary | ICD-10-CM | POA: Diagnosis not present

## 2022-12-18 DIAGNOSIS — R079 Chest pain, unspecified: Secondary | ICD-10-CM

## 2022-12-18 DIAGNOSIS — T466X5D Adverse effect of antihyperlipidemic and antiarteriosclerotic drugs, subsequent encounter: Secondary | ICD-10-CM | POA: Diagnosis not present

## 2022-12-18 DIAGNOSIS — T466X5A Adverse effect of antihyperlipidemic and antiarteriosclerotic drugs, initial encounter: Secondary | ICD-10-CM

## 2022-12-18 NOTE — Patient Instructions (Signed)
Medication Instructions:   New meds:   Repatha Sureclick 140mg /mL -- every 14 days Vascepa - 2 capsules twice daily  Try OTC Nexium for reflux  Dr. Rennis Golden recommends Repatha Sureclick 140mg /mL (PCSK9). This is an injectable cholesterol medication self-administered once every 14 days. This medication will likely need prior approval with your insurance company, which we will work on. If the medication is not approved initially, we may need to do an appeal with your insurance.   Administer medication in area of fatty tissue such as abdomen, outer thigh, back of upper arm - and rotate site with each injection Store medication in refrigerator until ready to administer - allow to sit at room temp for 30 mins - 1 hour prior to injection Dispose of medication in a SHARPS container - your pharmacy should be able to direct you on this and proper disposal   If you need a co-pay card for Repatha: [email or web address] If you need a co-pay card for Praluent: [email or web address]  Patient Assistance:    These foundations have funds at various times.   The [employer]: https://www.panfoundation.org/disease-funds/hypercholesterolemia/ -- can sign up for wait list  The [facility] offers assistance to help pay for medication copays.  They will cover copays for all cholesterol lowering meds, including statins, fibrates, omega-3 fish oils like Vascepa, ezetimibe, Repatha, Praluent, Nexletol, Nexlizet.  The cards are usually good for $2,500 or 12 months, whichever comes first. Our fax # is [phone] (you will need this to apply) Go to healthwellfoundation.org Click on "Apply Now" Answer questions as to whom is applying (patient or representative) Your disease fund will be "hypercholesterolemia - Medicare access" They will ask questions about finances and which medications you are taking for cholesterol When you submit, the approval is usually within  minutes.  You will need to print the card information from the site You will need to show this information to your pharmacy, they will bill your Medicare Part D plan first -then bill Health Well --for the copay.   You can also call them at [phone], although the hold times can be quite long.     *If you need a refill on your cardiac medications before your next appointment, please call your pharmacy*   Lab Work: FASTING lab work to check cholesterol in 3-4 months  If you have labs (blood work) drawn today and your tests are completely normal, you will receive your results only by: MyChart Message (if you have MyChart) OR A paper copy in the mail If you have any lab test that is abnormal or we need to change your treatment, we will call you to review the results.    Follow-Up: At [facility], you and your health needs are our priority.  As part of our continuing mission to provide you with exceptional heart care, we have created designated Provider Care Teams.  These Care Teams include your primary Cardiologist (physician) and Advanced Practice Providers (APPs -  Physician Assistants and Nurse Practitioners) who all work together to provide you with the care you need, when you need it.  We recommend signing up for the patient portal called "MyChart".  Sign up information is provided on this After Visit Summary.  MyChart is used to connect with patients for Virtual Visits (Telemedicine).  Patients are able to view lab/test results, encounter notes, upcoming appointments, etc.  Non-urgent messages can be sent to your provider as well.   To learn more about what you can do with MyChart, go to [email or web address].  Your next appointment:   4-6 months with Dr. Rennis Golden

## 2022-12-19 ENCOUNTER — Telehealth: Payer: Self-pay | Admitting: Pharmacy Technician

## 2022-12-19 ENCOUNTER — Other Ambulatory Visit (HOSPITAL_COMMUNITY): Payer: Self-pay

## 2022-12-19 NOTE — Telephone Encounter (Signed)
-----   Message from Nurse Eileen Stanford E sent at 12/18/2022  5:06 PM EDT ----- Regarding: PA - Repatha and Vascepa Hi team   This patient needs a prior auth for Repatha Sureclick and Vascepa 1 gram caps - 2 gram PO BID  Thanks

## 2022-12-19 NOTE — Telephone Encounter (Signed)
Pharmacy Patient Advocate Encounter   Received notification from  staff messages  that prior authorization for vascepa is required/requested.   Insurance verification completed.   The patient is insured through  [employer]  .   Per test claim: PA required; PA submitted to above mentioned insurance via Fax Key/confirmation #/EOC [id number] Status is pending

## 2022-12-19 NOTE — Telephone Encounter (Signed)
Pharmacy Patient Advocate Encounter   Received notification from  staff messages  that prior authorization for repatha is required/requested.   Insurance verification completed.   The patient is insured through  [employer]  .   Per test claim: PA required; PA submitted to above mentioned insurance via Fax Key/confirmation #/EOC [id number] Status is pending

## 2022-12-19 NOTE — Progress Notes (Signed)
OFFICE CONSULT NOTE  Chief Complaint:  Chest pain, dyslipidemia  Primary Care Physician: [facility]  HPI:  Scott Pineda is a 62 y.o. male who is being seen today for the evaluation of chest pain and dyslipidemia at the request of Burdine, Ananias Pilgrim, MD. this is a 62 year old male who was kindly referred for evaluation of dyslipidemia and recent chest pain.  He had presented to hospital with chest pain symptoms and hyperglycemia in September.  He underwent left heart catheterization by Dr. Rosemary Holms. This demonstrated diffuse proximal 30 to 40% LAD stenosis but otherwise no significant disease.  He did have intracardiac coronary physiology testing which did not suggest any microvascular dysfunction.  Medical therapy was recommended.  Subsequently he also had an echocardiogram demonstrated normal LVEF 60 to 65% and trivial aortic regurgitation but otherwise no significant findings.  This hospitalization he was noted to have a history of statin intolerance.  He had an LP(a) assessed which was negative.  His lipid profile showed total cholesterol 227, triglycerides 345, HDL 30 and LDL 128.  It was suggested he might be a good candidate for PCSK9 inhibitor.  He was started on Vascepa for elevated triglycerides in addition to ezetimibe.  Blood sugar has been difficult to control.  He is on long-acting insulin and he is wearing a continuous glucose monitor today.  With regards to the chest pain he reports that it is on and off and can occur with exertion or be there at rest.  It is typically in the left chest sometimes the left shoulder or the left shoulder blade and sometimes into his neck.  He does have a history of low back issues including prior fusion surgery of the lumbar spine.  He denies any history of cervical spine disease.  Status post cholecystectomy.  He has had a prior ventral hernia repair and has had some issues with that.  Its unknown whether he might have a hiatal  hernia.  PMHx:  Past Medical History:  Diagnosis Date   ADVERSE REACTION TO MEDICATION 10/09/2008   Qualifier: Diagnosis of  By: Fabian Sharp MD, Neta Mends    Allergic rhinitis    Asthma    Depression    GERD (gastroesophageal reflux disease)    Gout    Hyperlipidemia    Hypertension    Salivary stone    Trauma    hx of falling trauma and back pain with side effects of medication, parkisonian now resolved    Past Surgical History:  Procedure Laterality Date   APPENDECTOMY     CATARACT EXTRACTION     left 2009 and right 2006   CHOLECYSTECTOMY     CORONARY PRESSURE/FFR STUDY N/A 10/22/2022   Procedure: CORONARY PRESSURE/FFR STUDY;  Surgeon: Elder Negus, MD;  Location: [facility];  Service: Cardiovascular;  Laterality: N/A;   HERNIA REPAIR     LEFT HEART CATH AND CORONARY ANGIOGRAPHY N/A 10/22/2022   Procedure: LEFT HEART CATH AND CORONARY ANGIOGRAPHY;  Surgeon: Elder Negus, MD;  Location: [facility];  Service: Cardiovascular;  Laterality: N/A;   salivary stone extracted     TONSILLECTOMY      FAMHx:  Family History  Adopted: Yes  Family history unknown: Yes    SOCHx:   reports that he has never smoked. He has never used smokeless tobacco. He reports that he does not drink alcohol and does not use drugs.  ALLERGIES:  Allergies  Allergen Reactions   Glucophage Xr [  Metformin] Other (See Comments)    Abdominal/stomach pain   Motrin [Ibuprofen] Other (See Comments)    Unknown reaction   Statins Other (See Comments)    Myalgias  Weakness of significance     ROS: Pertinent items noted in HPI and remainder of comprehensive ROS otherwise negative.  HOME MEDS: Current Outpatient Medications on File Prior to Visit  Medication Sig Dispense Refill   aspirin EC 81 MG tablet Take 1 tablet (81 mg total) by mouth daily. Swallow whole. 30 tablet 1   dapagliflozin propanediol (FARXIGA) 5 MG TABS tablet Take 5 mg by mouth daily.      diphenhydramine-acetaminophen (TYLENOL PM) 25-500 MG TABS tablet Take 3 tablets by mouth at bedtime.     glipiZIDE (GLUCOTROL XL) 5 MG 24 hr tablet Take 5 mg by mouth daily after supper.     icosapent Ethyl (VASCEPA) 1 g capsule Take 2 capsules (2 g total) by mouth 2 (two) times daily. 120 capsule 1   insulin glargine (SEMGLEE, YFGN,) 100 UNIT/ML Solostar Pen Inject 25 Units into the skin every evening.     lisinopril (PRINIVIL,ZESTRIL) 20 MG tablet TAKE 1 TABLET (20 MG TOTAL) BY MOUTH DAILY. 90 tablet 1   sertraline (ZOLOFT) 100 MG tablet TAKE 1 TABLET (100 MG TOTAL) BY MOUTH DAILY. (Patient taking differently: Take 150 mg by mouth daily.) 90 tablet 1   ezetimibe (ZETIA) 10 MG tablet Take 1 tablet (10 mg total) by mouth daily. (Patient not taking: Reported on 12/18/2022) 30 tablet 1   No current facility-administered medications on file prior to visit.    LABS/IMAGING: No results found for this or any previous visit (from the past 48 hour(s)). No results found.  LIPID PANEL:    Component Value Date/Time   CHOL 227 (H) 10/22/2022 0330   TRIG 345 (H) 10/22/2022 0330   TRIG 249 09/01/2009 0000   HDL 30 (L) 10/22/2022 0330   CHOLHDL 7.6 10/22/2022 0330   VLDL 69 (H) 10/22/2022 0330   LDLCALC 128 (H) 10/22/2022 0330   LDLDIRECT 142.0 05/28/2015 0844    WEIGHTS: Wt Readings from Last 3 Encounters:  12/18/22 194 lb (88 kg)  10/26/22 180 lb 1.6 oz (81.7 kg)  07/28/16 216 lb (98 kg)    VITALS: BP (!) 148/90 (BP Location: Right Arm, Patient Position: Sitting, Cuff Size: Normal)   Pulse 99   Ht 6' (1.829 m)   Wt 194 lb (88 kg)   SpO2 96%   BMI 26.31 kg/m   EXAM: General appearance: alert, no distress, and thin Neck: no carotid bruit, no JVD, and thyroid not enlarged, symmetric, no tenderness/mass/nodules Lungs: clear to auscultation bilaterally Heart: regular rate and rhythm, S1, S2 normal, no murmur, click, rub or gallop Abdomen: soft, non-tender; bowel sounds normal; no  masses,  no organomegaly Extremities: extremities normal, atraumatic, no cyanosis or edema Pulses: 2+ and symmetric Skin: Skin color, texture, turgor normal. No rashes or lesions Neurologic: Grossly normal Psych: Pleasant  EKG: EKG Interpretation Date/Time:  Thursday December 18 2022 16:08:23 EDT Ventricular Rate:  99 PR Interval:  148 QRS Duration:  88 QT Interval:  350 QTC Calculation: 449 R Axis:   -45  Text Interpretation: Normal sinus rhythm Left axis deviation When compared with ECG of 23-Oct-2022 04:52, No significant change was found Confirmed by Zoila Shutter [phone]) on 12/18/2022 4:34:09 PM    ASSESSMENT: Chest pain with recent cardiac catheterization that demonstrated minimal to mild nonobstructive coronary disease and no evidence of small vessel disease Mixed  dyslipidemia with high triglycerides Statin intolerance  PLAN: 1.   Scott Pineda continues to have chest pain despite a cardiac catheterization which would suggest a noncoronary etiology.  Other possibilities include GI or perhaps hiatal hernia since he does have a history of ventral hernia.  He has a history of lumbar disc disease and may have some cervical disc disease causing his pain.  He he does need aggressive cardiovascular risk reduction.  His cholesterol has been high but Vascepa was recently added.  His LDL remains above target which would be less than 70 and since he cannot tolerate statins and already is on ezetimibe, he may be a good candidate to add Repatha.  Will reach out for prior authorization for this.  Will continue his other therapies.  He may need further evaluation from his primary care provider or perhaps reach out to his orthopedist as a possible causes of his chest pain.  To rule out possible reflux I advised him to trial short course of PPI to see if this improves some of his symptoms.  Will plan follow-up with repeat lipids in about 4 to 6 months.  Thanks again for the kind referral.  Chrystie Nose, MD, [facility]  [place]  [facility]  Medical Director of the Advanced Lipid Disorders &  Cardiovascular Risk Reduction Clinic Diplomate of the American Board of Clinical Lipidology Attending Cardiologist  Direct Dial: [phone]  Fax: [phone]  Website:  [email or web address] Nicely Charonda Hefter 12/19/2022, 12:14 PM

## 2022-12-24 ENCOUNTER — Other Ambulatory Visit (HOSPITAL_COMMUNITY): Payer: Self-pay

## 2022-12-26 NOTE — Telephone Encounter (Signed)
Pharmacy Patient Advocate Encounter  Received notification from  [employer]  that Prior Authorization for icosapent has been APPROVED from 12/26/22 to 12/26/23   PA #/Case ID/Reference #: [id number]

## 2022-12-30 ENCOUNTER — Other Ambulatory Visit (HOSPITAL_COMMUNITY): Payer: Self-pay

## 2022-12-31 ENCOUNTER — Other Ambulatory Visit (HOSPITAL_COMMUNITY): Payer: Self-pay

## 2023-01-05 ENCOUNTER — Other Ambulatory Visit (HOSPITAL_COMMUNITY): Payer: Self-pay

## 2023-01-06 ENCOUNTER — Other Ambulatory Visit (HOSPITAL_COMMUNITY): Payer: Self-pay

## 2023-01-07 ENCOUNTER — Other Ambulatory Visit (HOSPITAL_COMMUNITY): Payer: Self-pay

## 2023-01-07 NOTE — Telephone Encounter (Signed)
Pharmacy Patient Advocate Encounter  Received notification from  [employer]  that Prior Authorization for repatha has been APPROVED from 01/06/23 to 04/08/23   PA #/Case ID/Reference #: [id number]

## 2023-01-07 NOTE — Telephone Encounter (Signed)
LM for patient/wife to call back to discuss med approvals & preferred pharmacy.

## 2023-01-12 NOTE — Telephone Encounter (Signed)
LM to call back.

## 2023-01-19 MED ORDER — REPATHA SURECLICK 140 MG/ML ~~LOC~~ SOAJ: 1.0000 mL | SUBCUTANEOUS | 3 refills | Status: DC

## 2023-01-19 MED ORDER — ICOSAPENT ETHYL 1 G PO CAPS: 2.0000 g | ORAL_CAPSULE | Freq: Two times a day (BID) | ORAL | 3 refills | Status: DC

## 2023-01-19 NOTE — Addendum Note (Signed)
Addended by: Lindell Spar on: 01/19/2023 11:52 AM   Modules accepted: Orders

## 2023-01-19 NOTE — Telephone Encounter (Signed)
No return call from patient. Unable to review co-pay card info with him OR confirm preferred pharmacy. Rx(s) sent to [facility] in [place] as this was the most recent pharmacy meds were prescribed to, per chart review.

## 2023-01-30 ENCOUNTER — Telehealth: Payer: Self-pay | Admitting: Internal Medicine

## 2023-01-30 NOTE — Telephone Encounter (Signed)
Pt wife called in and state pt would like to do a provider switch  From: [facility]  To: [facility]

## 2023-04-22 ENCOUNTER — Ambulatory Visit (HOSPITAL_BASED_OUTPATIENT_CLINIC_OR_DEPARTMENT_OTHER): Payer: PRIVATE HEALTH INSURANCE | Admitting: Cardiology

## 2023-05-01 ENCOUNTER — Ambulatory Visit (HOSPITAL_BASED_OUTPATIENT_CLINIC_OR_DEPARTMENT_OTHER): Payer: PRIVATE HEALTH INSURANCE | Admitting: Cardiology

## 2023-05-22 ENCOUNTER — Ambulatory Visit (HOSPITAL_BASED_OUTPATIENT_CLINIC_OR_DEPARTMENT_OTHER): Payer: PRIVATE HEALTH INSURANCE | Admitting: Family

## 2023-05-22 VITALS — BP 130/80 | HR 101 | Ht 72.0 in | Wt 201.0 lb

## 2023-05-22 DIAGNOSIS — I2510 Atherosclerotic heart disease of native coronary artery without angina pectoris: Secondary | ICD-10-CM | POA: Diagnosis not present

## 2023-05-22 DIAGNOSIS — E785 Hyperlipidemia, unspecified: Secondary | ICD-10-CM

## 2023-05-22 DIAGNOSIS — I10 Essential (primary) hypertension: Secondary | ICD-10-CM

## 2023-05-22 DIAGNOSIS — R079 Chest pain, unspecified: Secondary | ICD-10-CM

## 2023-05-22 MED ORDER — METOPROLOL SUCCINATE ER 25 MG PO TB24: 25.0000 mg | ORAL_TABLET | Freq: Every day | ORAL | 3 refills | Status: AC

## 2023-05-22 MED ORDER — ASPIRIN 81 MG PO TBEC: 81.0000 mg | DELAYED_RELEASE_TABLET | Freq: Every day | ORAL | 1 refills | Status: AC
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
## 2023-05-22 NOTE — Patient Instructions (Addendum)
 Medication Instructions:  Your physician has recommended you make the following change in your medication:   Start: Metoprolol succinate 25mg  daily   Start: Aspirin 81mg  daily   Follow-Up: At [facility], you and your health needs are our priority.  As part of our continuing mission to provide you with exceptional heart care, our providers are all part of one team.  This team includes your primary Cardiologist (physician) and Advanced Practice Providers or APPs (Physician Assistants and Nurse Practitioners) who all work together to provide you with the care you need, when you need it.  Your next appointment:   4-6 weeks with Dr. Cristal Deer or APP

## 2023-05-24 ENCOUNTER — Encounter (HOSPITAL_BASED_OUTPATIENT_CLINIC_OR_DEPARTMENT_OTHER): Payer: Self-pay | Admitting: Family

## 2023-07-03 ENCOUNTER — Ambulatory Visit (HOSPITAL_BASED_OUTPATIENT_CLINIC_OR_DEPARTMENT_OTHER): Admitting: Family

## 2023-07-10 ENCOUNTER — Ambulatory Visit (HOSPITAL_BASED_OUTPATIENT_CLINIC_OR_DEPARTMENT_OTHER): Admitting: Family

## 2023-09-29 ENCOUNTER — Ambulatory Visit (HOSPITAL_BASED_OUTPATIENT_CLINIC_OR_DEPARTMENT_OTHER): Admitting: Family

## 2023-10-12 ENCOUNTER — Ambulatory Visit (HOSPITAL_BASED_OUTPATIENT_CLINIC_OR_DEPARTMENT_OTHER): Admitting: Family
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
## 2024-01-07 ENCOUNTER — Telehealth: Payer: Self-pay | Admitting: Cardiology

## 2024-01-07 NOTE — Assessment & Plan Note (Addendum)
 Patient currently well-controlled on current regimen, expressed interest in decreasing regimen but wants to get through divorce first.  Orders: .  sertraline  (ZOLOFT ) 100 mg tablet; Take one tablet (100 mg dose) by mouth 2 (two) times daily.

## 2024-01-07 NOTE — Assessment & Plan Note (Addendum)
 Patient here to establish care.  Sent refills for medications.  Will obtain labs to assess control of diabetes.  Sent patient with written prescription for freestyle libre 3 sensor.    Orders: .  glipiZIDE ER (GLUCOTROL XL) 5 mg 24 hr tablet; Take one tablet (5 mg dose) by mouth daily. .  dapagliflozin  (FARXIGA ) 5 mg tablet; Take one tablet (5 mg dose) by mouth daily. .  Comprehensive Metabolic Panel; Future .  LP+LDL/HDL Ratio; Future .  POCT A1C; Future .  CBC And Differential; Future .  POCT ACR URINE .  Misc. Devices MISC; Use 1 Freestyle libre 3 sensor to monitor blood sugar.  Replace every 14 days.

## 2024-01-07 NOTE — Telephone Encounter (Signed)
 Tried to call the patient and offer appointment for tomorrow with Dr. Lonni.  Call went to voicemail and patient does not have voicemail set up.  He is also not registered for MyChart to send message that way.  I have called the providers office back and spoke with Artist Gosling PA nurse Donal.  Let her know above.  The number they have listed in the chart is [phone].  I reached the patient ans have him scheduled with Dr. Lonni tomorrow 11/21 at 2pm.  He is aware to be here at 145 and bring copy of new insurance card with him for us  to update his file.     Gave patient ER precautions and let him know if pain comes back or worsens to go the the nearest ER.    He accepted appointment and agrees with plan.

## 2024-01-07 NOTE — Assessment & Plan Note (Addendum)
 Patient's cholesterol panel was abnormal, but he has tried and failed statin, Repatha , and Zetia  per his report.  I will reach out to cardiology to see what their recommendation is for this patient.  Blood  Orders: .  lisinopril  (PRINIVIL ,ZESTRIL ) 20 mg tablet; Take one tablet (20 mg dose) by mouth daily. .  Comprehensive Metabolic Panel; Future .  LP+LDL/HDL Ratio; Future .  CBC And Differential; Future .  ECG 12 lead unit performed

## 2024-01-07 NOTE — Assessment & Plan Note (Addendum)
 Obtained EKG in the clinic, did not show any acute changes but showed some LVH.  Refilled patient's prescriptions for lisinopril .  Will have patient begin 1 baby aspirin  by mouth per day as well as send in nitroglycerin  sublingual tablets to be used as needed for chest pain.  Recommend patient follow-up with cardiologist as soon as possible. Called cardiologist's office and spoke with one of the staff, they should reach out to schedule an appointment with the patient as soon as possible.  Orders: .  lisinopril  (PRINIVIL ,ZESTRIL ) 20 mg tablet; Take one tablet (20 mg dose) by mouth daily. .  ECG 12 lead unit performed .  aspirin  (ECOTRIN LOW DOSE) EC tablet; Take one tablet (81 mg dose) by mouth daily. .  nitroGLYCERIN  (NITROSTAT ) 0.4 mg SL tablet; Place one tablet (0.4 mg dose) under the tongue every 5 (five) minutes as needed for Chest pain.

## 2024-01-07 NOTE — Telephone Encounter (Signed)
 Office is calling to say that patient came to their office with check, neck, and pain along the SOB. Asking that the nurse reach out to the the patient. Please advise

## 2024-01-08 ENCOUNTER — Encounter (HOSPITAL_BASED_OUTPATIENT_CLINIC_OR_DEPARTMENT_OTHER): Payer: Self-pay | Admitting: Cardiology

## 2024-01-08 ENCOUNTER — Ambulatory Visit (INDEPENDENT_AMBULATORY_CARE_PROVIDER_SITE_OTHER): Admitting: Cardiology

## 2024-01-08 VITALS — BP 160/84 | HR 93 | Wt 199.5 lb

## 2024-01-08 DIAGNOSIS — E782 Mixed hyperlipidemia: Secondary | ICD-10-CM | POA: Diagnosis not present

## 2024-01-08 DIAGNOSIS — R079 Chest pain, unspecified: Secondary | ICD-10-CM | POA: Diagnosis not present

## 2024-01-08 DIAGNOSIS — M791 Myalgia, unspecified site: Secondary | ICD-10-CM

## 2024-01-08 DIAGNOSIS — T466X5A Adverse effect of antihyperlipidemic and antiarteriosclerotic drugs, initial encounter: Secondary | ICD-10-CM

## 2024-01-08 DIAGNOSIS — I2510 Atherosclerotic heart disease of native coronary artery without angina pectoris: Secondary | ICD-10-CM

## 2024-01-08 DIAGNOSIS — I10 Essential (primary) hypertension: Secondary | ICD-10-CM | POA: Diagnosis not present

## 2024-01-08 DIAGNOSIS — T466X5D Adverse effect of antihyperlipidemic and antiarteriosclerotic drugs, subsequent encounter: Secondary | ICD-10-CM

## 2024-01-08 NOTE — Patient Instructions (Addendum)
 [facility] Address: [address] floor, [place], [place] [address] Phone: [phone]  Since the [facility] cardiology office is much closer to your home, please feel free to establish care there.  Today, no medication changes, no labs or testing.

## 2024-01-08 NOTE — Progress Notes (Signed)
 Cardiology Office Note:  .   Date:  01/08/2024  ID:  Scott Pineda, DOB 03/28/1960, MRN [id number] PCP: Center, [facility]  [facility] Providers Cardiologist:  Shelda Bruckner, MD {  History of Present Illness: .   Scott Pineda is a 63 y.o. male with PMH nonobstructive CAD, type II diabetes, hypertension, mixed hyperlipidemia, remote sarcoidosis. He was previously seen by Dr. Mona and established care with me on 01/08/24.  Today: Received call from PCP office yesterday. Patient was seen and noted chest and neck pain with shortness of breath. Our office called the patient and brought him in today for urgent visit.  He was last seen by Reche Finder on 05/22/23.   He had cath 10/22/22 which showed nonobstructive disease, with proximal LAD with diffuse disease and 30-40% maximum stenosis. Testing was negative for microvascular dysfunction. Echo 10/22/22 with EF 60-65%, no wall motion abnormalities, no significant valve disease, normal pressures. He had myalgia on statin and has been treated with ezetimibe  and vascepa  in the past.   He notes chest, left neck, left jaw, left arm pain. He is under a lot of stress, currently getting a divorce, which is contentious. He notes significant worsening of his anxiety and depression.   He has been out of medication since he lost his job and his insurance. Hasn't been taking any of his medications. His new PCP refilled all of his medications yesterday. Reviewed note from PCP yesterday: ordered for aspirin  81 mg daily, lisinopril  20 mg daily, sertraline , glipizide, farxiga , insulin   We reviewed his current med list, also reports that he is taking metoprolol .   He reports severe fatigue on ezetimibe , stopped this. Had reactions to statin, will not take again. Doesn't have insurance right now for nexletol or PCSK9i. Doesn't recall taking vascepa , not interested in starting today.  His pain symptoms are worse when he lays down, sometimes  better if he moves his arm. Sometimes feels short of breath walking across the parking lot. He does have arthritis in his left shoulder and notes tenderness in this area.   Tchol 285, TG 364, HDL 35, LDL 178 yesterday  Planning to leave town this week, will be gone for 7-8 weeks. Drives a truck.   ROS: Denies PND, orthopnea, LE edema or unexpected weight gain. No syncope or palpitations. ROS otherwise negative except as noted.   Studies Reviewed: SABRA    EKG:  EKG Interpretation Date/Time:  Friday January 08 2024 13:57:54 EST Ventricular Rate:  91 PR Interval:  152 QRS Duration:  92 QT Interval:  366 QTC Calculation: 450 R Axis:   -47  Text Interpretation: Normal sinus rhythm Left axis deviation When compared with ECG of 22-May-2023 15:15, No significant change was found Confirmed by Bruckner Shelda [phone]) on 01/08/2024 2:26:47 PM    Physical Exam:   VS:  BP (!) 160/84   Pulse 93   Wt 199 lb 8 oz (90.5 kg)   SpO2 97%   BMI 27.06 kg/m    Wt Readings from Last 3 Encounters:  01/08/24 199 lb 8 oz (90.5 kg)  05/22/23 201 lb (91.2 kg)  12/18/22 194 lb (88 kg)    GEN: Well nourished, well developed in no acute distress HEENT: Normal, moist mucous membranes NECK: No JVD CARDIAC: regular rhythm, normal S1 and S2, no rubs or gallops. No murmur. CHEST: diffusely tender over left chest, left posterior shoulder and left upper back, left neck VASCULAR: Radial and DP pulses 2+ bilaterally. No carotid bruits  RESPIRATORY:  Clear to auscultation without rales, wheezing or rhonchi  ABDOMEN: Soft, non-tender, non-distended MUSCULOSKELETAL:  Ambulates independently SKIN: Warm and dry, no edema NEUROLOGIC:  Alert and oriented x 3. No focal neuro deficits noted. PSYCHIATRIC:  Normal affect    ASSESSMENT AND PLAN: .    Chest and arm pain Nonobstructive CAD Mixed hyperlipidemia -he is diffusely tender across left chest, left neck, left posterior shoulder and left upper back with  palpable muscle knots. He notes that this reproduces his pain. He has been given muscle relaxers but has not taken. Recommended he trial this given severity of his pain -cath without obstructive disease 10/2022, normal microvascular flow. Pain and history not consistent with cardiac etiology -continue aspirin  -discussed lipid lowering therapies. Had myalgia on statin. Previously was on ezetimibe , stopped due to side effects. Does not think he has ever taken vascepa . He does not want to restart medication at this time. -lpa <8.4 10/2022  Type II diabetes Hypertension -on lisinopril   -on insulin , glipizide, farxiga  -on metoprolol   CV risk counseling and prevention -recommend heart healthy/Mediterranean diet, with whole grains, fruits, vegetable, fish, lean meats, nuts, and olive oil. Limit salt. -recommend moderate walking, 3-5 times/week for 30-50 minutes each session. Aim for at least 150 minutes/week. Goal should be pace of 3 miles/hours, or walking 1.5 miles in 30 minutes -recommend avoidance of tobacco products. Avoid excess alcohol.  Dispo: he is looking to transfer care closer to his home in [place]. We looked at the map and there is a [facility] cardiology office right in [place]. We are always happy to see him back but he is interested in transferring his care there.   Total time of encounter: I spent 43 minutes dedicated to the care of this patient on the date of this encounter to include pre-visit review of records, face-to-face time with the patient discussing conditions above, and clinical documentation with the electronic health record. We specifically spent time today discussing his pain, possible etiologies of his pain, medications and importance of management of blood pressure and cholesterol, lifestyle recommendations.    Signed, Shelda Bruckner, MD   Shelda Bruckner, MD, PhD, [facility]  [facility]  [place]  [facility] at [facility]  at [facility] [address] [place], [place] [address] [phone]

## 2024-02-02 ENCOUNTER — Telehealth (HOSPITAL_BASED_OUTPATIENT_CLINIC_OR_DEPARTMENT_OTHER): Payer: Self-pay | Admitting: Cardiology

## 2024-02-02 NOTE — Telephone Encounter (Signed)
 Patient called to report his insurance was entered incorrectly and he is working with the insurance company to get this corrected.  Patient noted his current insurance company is [employer].
